# Patient Record
Sex: Female | Born: 1955
Health system: Southern US, Community
[De-identification: ages and names within clinical notes are randomized; demographics above are authoritative.]

## PROBLEM LIST (undated history)

## (undated) DIAGNOSIS — G4700 Insomnia, unspecified: Secondary | ICD-10-CM

## (undated) DIAGNOSIS — K449 Diaphragmatic hernia without obstruction or gangrene: Secondary | ICD-10-CM

## (undated) DIAGNOSIS — J302 Other seasonal allergic rhinitis: Secondary | ICD-10-CM

## (undated) DIAGNOSIS — D509 Iron deficiency anemia, unspecified: Secondary | ICD-10-CM

## (undated) DIAGNOSIS — K900 Celiac disease: Secondary | ICD-10-CM

## (undated) DIAGNOSIS — Z8619 Personal history of other infectious and parasitic diseases: Secondary | ICD-10-CM

## (undated) DIAGNOSIS — K648 Other hemorrhoids: Secondary | ICD-10-CM

## (undated) DIAGNOSIS — T7840XA Allergy, unspecified, initial encounter: Secondary | ICD-10-CM

## (undated) DIAGNOSIS — N200 Calculus of kidney: Secondary | ICD-10-CM

## (undated) DIAGNOSIS — K317 Polyp of stomach and duodenum: Secondary | ICD-10-CM

## (undated) DIAGNOSIS — D126 Benign neoplasm of colon, unspecified: Secondary | ICD-10-CM

## (undated) DIAGNOSIS — K5900 Constipation, unspecified: Secondary | ICD-10-CM

## (undated) DIAGNOSIS — K219 Gastro-esophageal reflux disease without esophagitis: Secondary | ICD-10-CM

## (undated) HISTORY — DX: Insomnia, unspecified: G47.00

## (undated) HISTORY — DX: Allergy, unspecified, initial encounter: T78.40XA

## (undated) HISTORY — DX: Benign neoplasm of colon, unspecified: D12.6

## (undated) HISTORY — DX: Polyp of stomach and duodenum: K31.7

## (undated) HISTORY — DX: Gastro-esophageal reflux disease without esophagitis: K21.9

## (undated) HISTORY — DX: Other seasonal allergic rhinitis: J30.2

## (undated) HISTORY — PX: UPPER GASTROINTESTINAL ENDOSCOPY: SHX188

## (undated) HISTORY — DX: Celiac disease: K90.0

## (undated) HISTORY — PX: COLONOSCOPY: SHX174

## (undated) HISTORY — DX: Diaphragmatic hernia without obstruction or gangrene: K44.9

## (undated) HISTORY — DX: Other hemorrhoids: K64.8

## (undated) HISTORY — DX: Personal history of other infectious and parasitic diseases: Z86.19

## (undated) HISTORY — DX: Calculus of kidney: N20.0

## (undated) HISTORY — DX: Iron deficiency anemia, unspecified: D50.9

## (undated) HISTORY — DX: Constipation, unspecified: K59.00

---
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
## 1997-07-16 ENCOUNTER — Other Ambulatory Visit: Admission: RE | Admit: 1997-07-16 | Discharge: 1997-07-16 | Payer: Self-pay | Admitting: Obstetrics and Gynecology

## 1998-03-01 ENCOUNTER — Ambulatory Visit (HOSPITAL_COMMUNITY): Admission: RE | Admit: 1998-03-01 | Discharge: 1998-03-01 | Payer: Self-pay | Admitting: Obstetrics and Gynecology

## 1998-03-01 ENCOUNTER — Encounter: Payer: Self-pay | Admitting: Obstetrics and Gynecology

## 1998-06-21 ENCOUNTER — Other Ambulatory Visit: Admission: RE | Admit: 1998-06-21 | Discharge: 1998-06-21 | Payer: Self-pay | Admitting: Obstetrics and Gynecology

## 1999-03-22 ENCOUNTER — Encounter: Payer: Self-pay | Admitting: Obstetrics and Gynecology

## 1999-03-22 ENCOUNTER — Ambulatory Visit (HOSPITAL_COMMUNITY): Admission: RE | Admit: 1999-03-22 | Discharge: 1999-03-22 | Payer: Self-pay | Admitting: Obstetrics and Gynecology

## 1999-03-28 ENCOUNTER — Ambulatory Visit (HOSPITAL_COMMUNITY): Admission: RE | Admit: 1999-03-28 | Discharge: 1999-03-28 | Payer: Self-pay | Admitting: Obstetrics and Gynecology

## 1999-03-28 ENCOUNTER — Encounter: Payer: Self-pay | Admitting: Obstetrics and Gynecology

## 1999-07-04 ENCOUNTER — Other Ambulatory Visit: Admission: RE | Admit: 1999-07-04 | Discharge: 1999-07-04 | Payer: Self-pay | Admitting: Obstetrics and Gynecology

## 2001-07-01 ENCOUNTER — Other Ambulatory Visit: Admission: RE | Admit: 2001-07-01 | Discharge: 2001-07-01 | Payer: Self-pay | Admitting: Obstetrics and Gynecology

## 2002-02-21 ENCOUNTER — Ambulatory Visit (HOSPITAL_COMMUNITY): Admission: RE | Admit: 2002-02-21 | Discharge: 2002-02-21 | Payer: Self-pay | Admitting: Obstetrics and Gynecology

## 2002-02-21 ENCOUNTER — Encounter: Payer: Self-pay | Admitting: Obstetrics and Gynecology

## 2003-03-02 ENCOUNTER — Ambulatory Visit (HOSPITAL_COMMUNITY): Admission: RE | Admit: 2003-03-02 | Discharge: 2003-03-02 | Payer: Self-pay | Admitting: Obstetrics and Gynecology

## 2003-09-04 ENCOUNTER — Other Ambulatory Visit: Admission: RE | Admit: 2003-09-04 | Discharge: 2003-09-04 | Payer: Self-pay | Admitting: Obstetrics and Gynecology

## 2003-11-04 ENCOUNTER — Encounter (INDEPENDENT_AMBULATORY_CARE_PROVIDER_SITE_OTHER): Payer: Self-pay | Admitting: Specialist

## 2003-11-04 ENCOUNTER — Ambulatory Visit (HOSPITAL_COMMUNITY): Admission: RE | Admit: 2003-11-04 | Discharge: 2003-11-04 | Payer: Self-pay | Admitting: *Deleted

## 2004-03-11 ENCOUNTER — Ambulatory Visit (HOSPITAL_COMMUNITY): Admission: RE | Admit: 2004-03-11 | Discharge: 2004-03-11 | Payer: Self-pay | Admitting: Obstetrics and Gynecology

## 2004-09-05 ENCOUNTER — Other Ambulatory Visit: Admission: RE | Admit: 2004-09-05 | Discharge: 2004-09-05 | Payer: Self-pay | Admitting: Obstetrics and Gynecology

## 2005-04-11 ENCOUNTER — Ambulatory Visit (HOSPITAL_COMMUNITY): Admission: RE | Admit: 2005-04-11 | Discharge: 2005-04-11 | Payer: Self-pay | Admitting: Obstetrics and Gynecology

## 2005-09-05 ENCOUNTER — Other Ambulatory Visit: Admission: RE | Admit: 2005-09-05 | Discharge: 2005-09-05 | Payer: Self-pay | Admitting: Obstetrics & Gynecology

## 2006-05-29 ENCOUNTER — Ambulatory Visit (HOSPITAL_COMMUNITY): Admission: RE | Admit: 2006-05-29 | Discharge: 2006-05-29 | Payer: Self-pay | Admitting: Obstetrics

## 2006-09-10 ENCOUNTER — Other Ambulatory Visit: Admission: RE | Admit: 2006-09-10 | Discharge: 2006-09-10 | Payer: Self-pay | Admitting: Obstetrics and Gynecology

## 2007-08-19 ENCOUNTER — Ambulatory Visit (HOSPITAL_COMMUNITY): Admission: RE | Admit: 2007-08-19 | Discharge: 2007-08-19 | Payer: Self-pay | Admitting: Obstetrics and Gynecology

## 2007-09-17 ENCOUNTER — Other Ambulatory Visit: Admission: RE | Admit: 2007-09-17 | Discharge: 2007-09-17 | Payer: Self-pay | Admitting: Obstetrics and Gynecology

## 2008-08-25 ENCOUNTER — Ambulatory Visit (HOSPITAL_COMMUNITY): Admission: RE | Admit: 2008-08-25 | Discharge: 2008-08-25 | Payer: Self-pay | Admitting: Obstetrics and Gynecology

## 2009-09-13 ENCOUNTER — Ambulatory Visit (HOSPITAL_COMMUNITY): Admission: RE | Admit: 2009-09-13 | Discharge: 2009-09-13 | Payer: Self-pay | Admitting: Obstetrics and Gynecology

## 2010-08-15 ENCOUNTER — Other Ambulatory Visit (HOSPITAL_COMMUNITY): Payer: Self-pay | Admitting: Occupational Therapy

## 2010-08-15 DIAGNOSIS — Z1231 Encounter for screening mammogram for malignant neoplasm of breast: Secondary | ICD-10-CM

## 2010-09-19 ENCOUNTER — Ambulatory Visit (HOSPITAL_COMMUNITY)
Admission: RE | Admit: 2010-09-19 | Discharge: 2010-09-19 | Disposition: A | Discharge status: Home / Self Care | Payer: 59 | Source: Ambulatory Visit | Attending: Obstetrics and Gynecology | Admitting: Obstetrics and Gynecology

## 2010-09-19 ENCOUNTER — Ambulatory Visit (HOSPITAL_COMMUNITY): Payer: Self-pay

## 2010-09-19 DIAGNOSIS — Z1231 Encounter for screening mammogram for malignant neoplasm of breast: Secondary | ICD-10-CM | POA: Insufficient documentation

## 2011-09-13 DIAGNOSIS — D126 Benign neoplasm of colon, unspecified: Secondary | ICD-10-CM

## 2011-09-13 HISTORY — PX: ESOPHAGOGASTRODUODENOSCOPY: SHX1529

## 2011-09-13 HISTORY — DX: Benign neoplasm of colon, unspecified: D12.6

## 2011-09-13 HISTORY — PX: COLONOSCOPY W/ BIOPSIES: SHX1374

## 2011-09-13 LAB — HM COLONOSCOPY

## 2011-09-18 ENCOUNTER — Other Ambulatory Visit (HOSPITAL_COMMUNITY): Payer: Self-pay | Admitting: Occupational Therapy

## 2011-09-18 DIAGNOSIS — Z1231 Encounter for screening mammogram for malignant neoplasm of breast: Secondary | ICD-10-CM

## 2011-10-04 ENCOUNTER — Ambulatory Visit (HOSPITAL_COMMUNITY)
Admission: RE | Admit: 2011-10-04 | Discharge: 2011-10-04 | Disposition: A | Discharge status: Home / Self Care | Payer: 59 | Source: Ambulatory Visit | Attending: Obstetrics & Gynecology | Admitting: Obstetrics & Gynecology

## 2011-10-04 DIAGNOSIS — Z1231 Encounter for screening mammogram for malignant neoplasm of breast: Secondary | ICD-10-CM | POA: Insufficient documentation

## 2011-10-24 LAB — HM PAP SMEAR: HM Pap smear: NEGATIVE

## 2011-12-15 HISTORY — PX: FOOT SURGERY: SHX648

## 2011-12-15 HISTORY — PX: BUNIONECTOMY: SHX129

## 2012-12-16 ENCOUNTER — Ambulatory Visit (INDEPENDENT_AMBULATORY_CARE_PROVIDER_SITE_OTHER): Payer: 59 | Admitting: Podiatry

## 2012-12-16 ENCOUNTER — Encounter: Payer: Self-pay | Admitting: Podiatry

## 2012-12-16 ENCOUNTER — Ambulatory Visit (INDEPENDENT_AMBULATORY_CARE_PROVIDER_SITE_OTHER): Payer: 59

## 2012-12-16 VITALS — BP 139/91 | HR 71 | Resp 18

## 2012-12-16 DIAGNOSIS — M79609 Pain in unspecified limb: Secondary | ICD-10-CM

## 2012-12-16 DIAGNOSIS — M79671 Pain in right foot: Secondary | ICD-10-CM

## 2012-12-16 DIAGNOSIS — M2010 Hallux valgus (acquired), unspecified foot: Secondary | ICD-10-CM

## 2012-12-16 NOTE — Progress Notes (Signed)
°  Subjective:    Patient ID: Tara Hendrix, female    DOB: 12-13-1955, 57 y.o.   MRN: [id number]  HPI rechecking right foot and some swelling and there is a bump on incision site    Review of Systems  Constitutional: Negative.   HENT: Negative.   Eyes: Negative.   Respiratory: Negative.   Cardiovascular: Negative.   Gastrointestinal: Negative.   Endocrine: Negative.   Genitourinary: Negative.   Musculoskeletal: Negative.   Skin: Negative.   Allergic/Immunologic: Negative.   Neurological: Negative.   Hematological: Bruises/bleeds easily.  Psychiatric/Behavioral: Negative.        Objective:   Physical Exam        Assessment & Plan:

## 2012-12-16 NOTE — Progress Notes (Signed)
Subjective:     Patient ID: Tara Hendrix, female   DOB: 1956/01/16, 57 y.o.   MRN: [id number]  HPI patient states I just wanted to check on my left big toe   Review of Systems  All other systems reviewed and are negative.       Objective:   Physical Exam  Nursing note and vitals reviewed. Cardiovascular: Intact distal pulses.   Neurological: She is alert.  Skin: Skin is warm.   this structure of the right big toe and first MPJ looks good mild edema noted. Slight prominence at the distal interphalangeal joint right big toe     Assessment:     We moved the big toe in order to reduce frontal plane deformity leading to slight enlargement    Plan:     X-rays reviewed and condition explained to patient. Patient has no pain and is discharged un less any issue should occur

## 2013-01-02 ENCOUNTER — Other Ambulatory Visit: Payer: Self-pay

## 2014-07-07 ENCOUNTER — Encounter: Payer: Self-pay | Admitting: Nurse Practitioner

## 2014-09-14 ENCOUNTER — Telehealth: Payer: Self-pay | Admitting: *Deleted

## 2014-09-14 ENCOUNTER — Ambulatory Visit (INDEPENDENT_AMBULATORY_CARE_PROVIDER_SITE_OTHER): Payer: 59 | Admitting: Nurse Practitioner

## 2014-09-14 ENCOUNTER — Encounter: Payer: Self-pay | Admitting: Nurse Practitioner

## 2014-09-14 VITALS — BP 130/76 | HR 76 | Ht 65.25 in | Wt 152.0 lb

## 2014-09-14 DIAGNOSIS — K2100 Gastro-esophageal reflux disease with esophagitis, without bleeding: Secondary | ICD-10-CM

## 2014-09-14 DIAGNOSIS — Z01419 Encounter for gynecological examination (general) (routine) without abnormal findings: Secondary | ICD-10-CM | POA: Diagnosis not present

## 2014-09-14 DIAGNOSIS — Z0000 Encounter for general adult medical examination without abnormal findings: Secondary | ICD-10-CM | POA: Diagnosis not present

## 2014-09-14 DIAGNOSIS — K219 Gastro-esophageal reflux disease without esophagitis: Secondary | ICD-10-CM | POA: Insufficient documentation

## 2014-09-14 LAB — POCT URINALYSIS DIPSTICK
Bilirubin, UA: NEGATIVE
Blood, UA: NEGATIVE
Glucose, UA: NEGATIVE
Ketones, UA: NEGATIVE
Leukocytes, UA: NEGATIVE
Nitrite, UA: NEGATIVE
Protein, UA: NEGATIVE
Urobilinogen, UA: NEGATIVE
pH, UA: 6

## 2014-09-14 MED ORDER — ZOLPIDEM TARTRATE 5 MG PO TABS: 5.0000 mg | ORAL_TABLET | Freq: Every evening | ORAL | Status: DC | PRN

## 2014-09-14 MED ORDER — ZOLPIDEM TARTRATE ER 6.25 MG PO TBCR: 6.2500 mg | EXTENDED_RELEASE_TABLET | Freq: Every evening | ORAL | Status: DC | PRN

## 2014-09-14 NOTE — Telephone Encounter (Signed)
New Rx faxed to [facility] today

## 2014-09-14 NOTE — Patient Instructions (Addendum)

## 2014-09-14 NOTE — Progress Notes (Signed)
Patient ID: Tara Hendrix, female   DOB: 03-22-1955, 59 y.o.   MRN: [id number] 59 y.o. G62P0010 Married  Caucasian Fe here for annual exam.  Now no meneses sice 11/ 2015.  Some warm in general no flashing. No vaginal dryness.  Patient's last menstrual period was 01/11/2014 (exact date).          Sexually active: Yes.    The current method of family planning is vasectomy.    Exercising: No.  The patient does not participate in regular exercise at present. Smoker:  Yes, 1 PPD  Health Maintenance: Pap:  10/24/11, negative with neg HR HPV MMG:  10/04/11, Bi-Rads 1:  Negative Colonoscopy:  09/13/11, tubular adenoma, repeat in 5 years BMD:   2008, no report in chart, normal per patient TDaP:  10/23/10 Labs:  HB:  15.6  Urine:  Negative    reports that she has been smoking Cigarettes.  She has been smoking about 1.00 pack per day. She has never used smokeless tobacco. She reports that she drinks about 6.0 oz of alcohol per week. She reports that she does not use illicit drugs.  Past Medical History  Diagnosis Date  . GERD (gastroesophageal reflux disease)     Past Surgical History  Procedure Laterality Date  . Foot surgery Right 12/15/2011    Current Outpatient Prescriptions  Medication Sig Dispense Refill  . zolpidem (AMBIEN) 5 MG tablet Take 1 tablet (5 mg total) by mouth at bedtime as needed for sleep. 30 tablet 0   No current facility-administered medications for this visit.    Family History  Problem Relation Age of Onset  . Heart attack Mother   . Hypertension Mother   . Cancer Paternal Grandmother     unsure type  . Stroke Paternal Grandfather     ROS:  Pertinent items are noted in HPI.  Otherwise, a comprehensive ROS was negative.  Exam:   BP 130/76 mmHg  Pulse 76  Ht 5' 5.25" (1.657 m)  Wt 152 lb (68.947 kg)  BMI 25.11 kg/m2  LMP 01/11/2014 (Exact Date) Height: 5' 5.25" (165.7 cm) Ht Readings from Last 3 Encounters:  09/14/14 5' 5.25" (1.657 m)    General  appearance: alert, cooperative and appears stated age Head: Normocephalic, without obvious abnormality, atraumatic Neck: no adenopathy, supple, symmetrical, trachea midline and thyroid normal to inspection and palpation Lungs: clear to auscultation bilaterally Breasts: normal appearance, no masses or tenderness Heart: regular rate and rhythm Abdomen: soft, non-tender; no masses,  no organomegaly Extremities: extremities normal, atraumatic, no cyanosis or edema Skin: Skin color, texture, turgor normal. No rashes or lesions Lymph nodes: Cervical, supraclavicular, and axillary nodes normal. No abnormal inguinal nodes palpated Neurologic: Grossly normal   Pelvic: External genitalia:  no lesions              Urethra:  normal appearing urethra with no masses, tenderness or lesions              Bartholin's and Skene's: normal                 Vagina: normal appearing vagina with normal color and discharge, no lesions              Cervix: anteverted              Pap taken: Yes.   Bimanual Exam:  Uterus:  normal size, contour, position, consistency, mobility, non-tender              Adnexa: no  mass, fullness, tenderness               Rectovaginal: Confirms               Anus:  normal sphincter tone, no lesions  Chaperone present:  yes  A:  Well Woman with normal exam  Postmenopausal   GERD and would like to see a different GI than previous  P:   Reviewed health and wellness pertinent to exam  Pap smear as above  Mammogram isdue and will schedule counseled on breast self exam, mammography screening, adequate intake of calcium and vitamin D, diet and exercise, Kegel's exercises return annually or prn  An After Visit Summary was printed and given to the patient.

## 2014-09-14 NOTE — Telephone Encounter (Signed)
Tara Hendrix from [facility] calling to get Rx for Ambien CR 6.25 mg changed. States insurance will not cover any Ambien Cr, just regular Ambien. If allowed new Rx for regular Ambien 5 mg or 10 mg needed.

## 2014-09-14 NOTE — Telephone Encounter (Signed)
OK for new RX of Ambien to be 5 mg prn sleep # 30 / 0 refills

## 2014-09-15 ENCOUNTER — Other Ambulatory Visit (INDEPENDENT_AMBULATORY_CARE_PROVIDER_SITE_OTHER): Payer: 59

## 2014-09-15 DIAGNOSIS — Z0000 Encounter for general adult medical examination without abnormal findings: Secondary | ICD-10-CM

## 2014-09-15 LAB — COMPREHENSIVE METABOLIC PANEL
ALT: 11 U/L (ref 0–35)
AST: 12 U/L (ref 0–37)
Albumin: 4 g/dL (ref 3.5–5.2)
Alkaline Phosphatase: 63 U/L (ref 39–117)
BUN: 11 mg/dL (ref 6–23)
CO2: 25 mEq/L (ref 19–32)
Calcium: 9.4 mg/dL (ref 8.4–10.5)
Chloride: 103 mEq/L (ref 96–112)
Creat: 0.72 mg/dL (ref 0.50–1.10)
Glucose, Bld: 106 mg/dL — ABNORMAL HIGH (ref 70–99)
Potassium: 4.2 mEq/L (ref 3.5–5.3)
Sodium: 139 mEq/L (ref 135–145)
Total Bilirubin: 0.5 mg/dL (ref 0.2–1.2)
Total Protein: 6.7 g/dL (ref 6.0–8.3)

## 2014-09-15 LAB — LIPID PANEL
Cholesterol: 241 mg/dL — ABNORMAL HIGH (ref 0–200)
HDL: 51 mg/dL (ref >=46)
LDL Cholesterol: 156 mg/dL — ABNORMAL HIGH (ref 0–99)
Total CHOL/HDL Ratio: 4.7 Ratio
Triglycerides: 172 mg/dL — ABNORMAL HIGH (ref <150)
VLDL: 34 mg/dL (ref 0–40)

## 2014-09-15 LAB — FOLLICLE STIMULATING HORMONE: FSH: 107.1 m[IU]/mL

## 2014-09-15 LAB — HEMOGLOBIN, FINGERSTICK: Hemoglobin, fingerstick: 15.6 g/dL (ref 12.0–16.0)

## 2014-09-16 LAB — IPS PAP TEST WITH HPV

## 2014-09-16 LAB — VITAMIN D 25 HYDROXY (VIT D DEFICIENCY, FRACTURES): Vit D, 25-Hydroxy: 26 ng/mL — ABNORMAL LOW (ref 30–100)

## 2014-09-16 LAB — TSH: TSH: 1.798 u[IU]/mL (ref 0.350–4.500)

## 2014-09-21 NOTE — Progress Notes (Signed)
Encounter reviewed by Dr. Brook Amundson C. Silva.  

## 2014-12-22 ENCOUNTER — Telehealth: Payer: Self-pay | Admitting: *Deleted

## 2014-12-22 DIAGNOSIS — E785 Hyperlipidemia, unspecified: Secondary | ICD-10-CM

## 2014-12-22 DIAGNOSIS — R899 Unspecified abnormal finding in specimens from other organs, systems and tissues: Secondary | ICD-10-CM

## 2014-12-22 NOTE — Telephone Encounter (Signed)
I spoke with patient to schedule lab appointment.  Pt is scheduled for 10/28 @ 8:45am. Orders entered in EPIC.

## 2014-12-22 NOTE — Telephone Encounter (Signed)
-----   Message from Graylon Good, [place] sent at 09/16/2014 10:43 AM EDT ----- Regarding: repeat labs Need HGB AIC 3 months from 09/14/14.

## 2014-12-25 ENCOUNTER — Other Ambulatory Visit: Payer: 59

## 2014-12-25 DIAGNOSIS — R6889 Other general symptoms and signs: Secondary | ICD-10-CM

## 2014-12-25 DIAGNOSIS — E785 Hyperlipidemia, unspecified: Secondary | ICD-10-CM

## 2014-12-25 DIAGNOSIS — R899 Unspecified abnormal finding in specimens from other organs, systems and tissues: Secondary | ICD-10-CM

## 2014-12-25 LAB — HEMOGLOBIN A1C
Hgb A1c MFr Bld: 5.7 % — ABNORMAL HIGH (ref <5.7)
Mean Plasma Glucose: 117 mg/dL — ABNORMAL HIGH (ref <117)

## 2014-12-25 LAB — LIPID PANEL
Cholesterol: 255 mg/dL — ABNORMAL HIGH (ref 125–200)
HDL: 85 mg/dL (ref >=46)
LDL Cholesterol: 131 mg/dL — ABNORMAL HIGH (ref <130)
Total CHOL/HDL Ratio: 3 Ratio (ref <=5.0)
Triglycerides: 194 mg/dL — ABNORMAL HIGH (ref <150)
VLDL: 39 mg/dL — ABNORMAL HIGH (ref <30)

## 2015-09-27 ENCOUNTER — Ambulatory Visit (INDEPENDENT_AMBULATORY_CARE_PROVIDER_SITE_OTHER): Payer: 59 | Admitting: Nurse Practitioner

## 2015-09-27 ENCOUNTER — Encounter: Payer: Self-pay | Admitting: Nurse Practitioner

## 2015-09-27 VITALS — BP 134/88 | HR 68 | Ht 65.25 in | Wt 153.0 lb

## 2015-09-27 DIAGNOSIS — Z01419 Encounter for gynecological examination (general) (routine) without abnormal findings: Secondary | ICD-10-CM

## 2015-09-27 DIAGNOSIS — G4700 Insomnia, unspecified: Secondary | ICD-10-CM | POA: Diagnosis not present

## 2015-09-27 DIAGNOSIS — Z0000 Encounter for general adult medical examination without abnormal findings: Secondary | ICD-10-CM

## 2015-09-27 DIAGNOSIS — N950 Postmenopausal bleeding: Secondary | ICD-10-CM

## 2015-09-27 LAB — CBC WITH DIFFERENTIAL/PLATELET
Basophils Absolute: 0 cells/uL (ref 0–200)
Basophils Relative: 0 %
Eosinophils Absolute: 174 cells/uL (ref 15–500)
Eosinophils Relative: 2 %
HCT: 43 % (ref 35.0–45.0)
Hemoglobin: 14.6 g/dL (ref 11.7–15.5)
Lymphocytes Relative: 21 %
Lymphs Abs: 1827 cells/uL (ref 850–3900)
MCH: 33.4 pg — ABNORMAL HIGH (ref 27.0–33.0)
MCHC: 34 g/dL (ref 32.0–36.0)
MCV: 98.4 fL (ref 80.0–100.0)
MPV: 11 fL (ref 7.5–12.5)
Monocytes Absolute: 609 cells/uL (ref 200–950)
Monocytes Relative: 7 %
Neutro Abs: 6090 cells/uL (ref 1500–7800)
Neutrophils Relative %: 70 %
Platelets: 199 10*3/uL (ref 140–400)
RBC: 4.37 MIL/uL (ref 3.80–5.10)
RDW: 12.8 % (ref 11.0–15.0)
WBC: 8.7 10*3/uL (ref 3.8–10.8)

## 2015-09-27 LAB — POCT URINALYSIS DIPSTICK
Bilirubin, UA: NEGATIVE
Blood, UA: NEGATIVE
Glucose, UA: NEGATIVE
Ketones, UA: NEGATIVE
Leukocytes, UA: NEGATIVE
Nitrite, UA: NEGATIVE
Protein, UA: NEGATIVE
Urobilinogen, UA: NEGATIVE
pH, UA: 5

## 2015-09-27 LAB — COMPREHENSIVE METABOLIC PANEL
ALT: 14 U/L (ref 6–29)
AST: 15 U/L (ref 10–35)
Albumin: 4.5 g/dL (ref 3.6–5.1)
Alkaline Phosphatase: 64 U/L (ref 33–130)
BUN: 14 mg/dL (ref 7–25)
CO2: 23 mmol/L (ref 20–31)
Calcium: 9.7 mg/dL (ref 8.6–10.4)
Chloride: 104 mmol/L (ref 98–110)
Creat: 0.8 mg/dL (ref 0.50–1.05)
Glucose, Bld: 117 mg/dL — ABNORMAL HIGH (ref 65–99)
Potassium: 5 mmol/L (ref 3.5–5.3)
Sodium: 138 mmol/L (ref 135–146)
Total Bilirubin: 0.6 mg/dL (ref 0.2–1.2)
Total Protein: 6.6 g/dL (ref 6.1–8.1)

## 2015-09-27 LAB — LIPID PANEL
Cholesterol: 251 mg/dL — ABNORMAL HIGH (ref 125–200)
HDL: 80 mg/dL (ref >=46)
LDL Cholesterol: 141 mg/dL — ABNORMAL HIGH (ref <130)
Total CHOL/HDL Ratio: 3.1 Ratio (ref <=5.0)
Triglycerides: 152 mg/dL — ABNORMAL HIGH (ref <150)
VLDL: 30 mg/dL (ref <30)

## 2015-09-27 LAB — TSH: TSH: 1.48 mIU/L

## 2015-09-27 LAB — HEMOGLOBIN A1C
Hgb A1c MFr Bld: 5.6 % (ref <5.7)
Mean Plasma Glucose: 114 mg/dL

## 2015-09-27 LAB — HEPATITIS C ANTIBODY: HCV Ab: NEGATIVE

## 2015-09-27 LAB — HIV ANTIBODY (ROUTINE TESTING W REFLEX): HIV 1&2 Ab, 4th Generation: NONREACTIVE

## 2015-09-27 LAB — HEMOGLOBIN, FINGERSTICK: Hemoglobin, fingerstick: 14.5 g/dL (ref 12.0–16.0)

## 2015-09-27 MED ORDER — ZOLPIDEM TARTRATE 5 MG PO TABS: 5.0000 mg | ORAL_TABLET | Freq: Every evening | ORAL | 5 refills | Status: DC | PRN

## 2015-09-27 NOTE — Progress Notes (Signed)
Patient ID: Tara Hendrix, female   DOB: April 03, 1955, 60 y.o.   MRN: [id number]  60 y.o. G39P0010 Married  Caucasian Fe here for annual exam. No new health diagnosis. No vaso symptoms. About 6 & 8 months ago had a spot of pink mucous when she wiped.  No discharge on panty liner and no other discharge the same day. She did note symptoms of a cramp like period was going to start but nothing else.  Symptoms of bleeding seemed to go away after wiping.  She does not recall either time being related to SA.  She also did not have any urinary symptoms.  Patient's last menstrual period was 01/11/2014 (exact date).          Sexually active: Yes.    The current method of family planning is vasectomy.    Exercising: No.  The patient does not participate in regular exercise at present. Smoker:  yes  Health Maintenance: Pap: 09/14/14, Negative with neg HR HPV MMG:  10/04/11, Bi-Rads 1:  Negative, pt states she had mammogram 2 years ago at [facility] - no report Colonoscopy:  09/13/11, tubular adenoma, repeat in 5 years BMD:   2008, no report in chart, normal per patient TDaP:  10/23/10 Hep C and HIV: done today Labs: HB: 14.5  Urine: negative   reports that she has been smoking Cigarettes.  She has been smoking about 1.00 pack per day. She has never used smokeless tobacco. She reports that she drinks about 6.0 oz of alcohol per week . She reports that she does not use drugs.  Past Medical History:  Diagnosis Date  . GERD (gastroesophageal reflux disease)     Past Surgical History:  Procedure Laterality Date  . FOOT SURGERY Right 12/15/2011    Current Outpatient Prescriptions  Medication Sig Dispense Refill  . zolpidem (AMBIEN) 5 MG tablet Take 1 tablet (5 mg total) by mouth at bedtime as needed for sleep. 30 tablet 0   No current facility-administered medications for this visit.     Family History  Problem Relation Age of Onset  . Heart attack Mother   . Hypertension Mother   . Cancer  Paternal Grandmother     unsure type  . Stroke Paternal Grandfather     ROS:  Pertinent items are noted in HPI.  Otherwise, a comprehensive ROS was negative.  Exam:   BP 134/88 (BP Location: Right Arm, Patient Position: Sitting, Cuff Size: Normal)   Pulse 68   Ht 5' 5.25" (1.657 m)   Wt 153 lb (69.4 kg)   LMP 01/11/2014 (Exact Date)   BMI 25.27 kg/m  Height: 5' 5.25" (165.7 cm) Ht Readings from Last 3 Encounters:  09/27/15 5' 5.25" (1.657 m)  09/14/14 5' 5.25" (1.657 m)    General appearance: alert, cooperative and appears stated age Head: Normocephalic, without obvious abnormality, atraumatic Neck: no adenopathy, supple, symmetrical, trachea midline and thyroid normal to inspection and palpation Lungs: clear to auscultation bilaterally Breasts: normal appearance, no masses or tenderness Heart: regular rate and rhythm Abdomen: soft, non-tender; no masses,  no organomegaly Extremities: extremities normal, atraumatic, no cyanosis or edema Skin: Skin color, texture, turgor normal. No rashes or lesions Lymph nodes: Cervical, supraclavicular, and axillary nodes normal. No abnormal inguinal nodes palpated Neurologic: Grossly normal   Pelvic: External genitalia:  no lesions              Urethra:  normal appearing urethra with no masses, tenderness or lesions  Bartholin's and Skene's: normal                 Vagina: normal appearing vagina with normal color and discharge, no lesions              Cervix: anteverted, no cervical polyp noted              Pap taken: No. Bimanual Exam:  Uterus:  normal size, contour, position, consistency, mobility, non-tender              Adnexa: no mass, fullness, tenderness               Rectovaginal: Confirms               Anus:  normal sphincter tone, no lesions  Chaperone present: yes  A:  Well Woman with normal exam  Postmenopausal - last [place] was 107.1 09/14/14  ? PMB and will evaluate              Insomnia - chronic due to  travel schedule - works outside [employer] job.   P:   Reviewed health and wellness pertinent to exam  Pap smear as above  Mammogram is past due and will schedule - [facility]  Will get EMB with Dr. Sabra Heck and follow, she may need a PUS later depending on findings  Will follow with labs  Refill on Ambien 5 mg HS prn  She will get colonoscopy next year  Counseled on breast self exam, mammography screening, adequate intake of calcium and vitamin D, diet and exercise, Kegel's exercises return annually or prn  An After Visit Summary was printed and given to the patient.

## 2015-09-27 NOTE — Patient Instructions (Signed)

## 2015-09-27 NOTE — Progress Notes (Signed)
Reviewed personally.  M. Suzanne Nicodemus Denk, MD.  

## 2015-09-28 LAB — VITAMIN D 25 HYDROXY (VIT D DEFICIENCY, FRACTURES): Vit D, 25-Hydroxy: 32 ng/mL (ref 30–100)

## 2015-10-07 ENCOUNTER — Telehealth: Payer: Self-pay | Admitting: *Deleted

## 2015-10-07 NOTE — Telephone Encounter (Signed)
I have attempted to contact this patient by phone with the following results: left message to return call to [place] at [phone] on answering machine (mobile per [facility]).  Name verified in [place], advised call was regarding recent labs and dietician referral.  [phone] [facility])

## 2015-10-07 NOTE — Telephone Encounter (Signed)
-----   Message from Kem Boroughs, [place] sent at 09/28/2015  8:39 AM EDT ----- Results via my chart:  Call her and see if she wants Dietician referral.  Pam,  The Hep C and HIV was negative as expected. The Vit D was 32 compared to 26 last year.  Please continue to eat dark green vegetables.  The HGB AIC (average blood sugar for 3 mo) was 5.6 - better than last year.  But still bears watching your carb's. The blood sugar in the CMP was at 117 - which is OK if you were not fasting.  The thyroid, kidney, liver, CBC was all normal.  The lipid panel shows an elevated total cholesterol at 251, triglyceride at 152 and LDL (bad cholesterol) at 141.  In comparison to 9 months ago there is some improvement of the triglyceride but about the same for the others.  Do you feel you need to see a dietician?  If so we can help with that.  You will also need to follow with PCP as you may need medication therapy.

## 2015-10-08 ENCOUNTER — Telehealth: Payer: Self-pay | Admitting: Obstetrics & Gynecology

## 2015-10-08 NOTE — Telephone Encounter (Signed)
Called patient to review benefits for procedure. Left voicemail to call back and review. °

## 2015-10-08 NOTE — Telephone Encounter (Signed)
Spoke with patient. Reviewed benefits. Ok to close.

## 2015-11-09 ENCOUNTER — Ambulatory Visit (INDEPENDENT_AMBULATORY_CARE_PROVIDER_SITE_OTHER): Payer: 59 | Admitting: Obstetrics & Gynecology

## 2015-11-09 DIAGNOSIS — N950 Postmenopausal bleeding: Secondary | ICD-10-CM | POA: Diagnosis not present

## 2015-11-09 NOTE — Patient Instructions (Signed)

## 2015-11-09 NOTE — Progress Notes (Signed)
GYNECOLOGY  VISIT   HPI: 60 y.o. G18P0010 Married Caucasian female with two episode of PMP bleeding about six months ago.  She noticed this with wiping only.  She had [facility] 09/12/15 that was 107.  Pt was recommended to proceed with endometrial biopsy by Kem Boroughs.  She is here for this today.  Reports with the bleeding, she had no breast tenderness or other hormonal-type symptoms.  Denies any urinary changes/concerns or bowel habit changes.  Does not think this was urinary or GI related.  Last MMG was 2013 in chart.  Pt is sure she's had one within the last two years but thought [facility] was [facility].  Last colonoscopy was 7/13.  Follow up due next year.  Patient Active Problem List   Diagnosis Date Noted  . GERD (gastroesophageal reflux disease)     Past Medical History:  Diagnosis Date  . GERD (gastroesophageal reflux disease)     Past Surgical History:  Procedure Laterality Date  . FOOT SURGERY Right 12/15/2011    MEDS:  Reviewed in EPIC and UTD  ALLERGIES: Review of patient's allergies indicates no known allergies.  Family History  Problem Relation Age of Onset  . Heart attack Mother   . Hypertension Mother   . Cancer Paternal Grandmother     unsure type  . Stroke Paternal Grandfather     SH:  Married, non smoker  Review of Systems  All other systems reviewed and are negative.   PHYSICAL EXAMINATION:    BP (!) 144/86 (BP Location: Right Arm, Patient Position: Sitting, Cuff Size: Normal)   Pulse 78   Resp 14   Ht 5' 5.25" (1.657 m)   Wt 154 lb (69.9 kg)   LMP 01/11/2014 (Exact Date)   BMI 25.43 kg/m     General appearance: alert, cooperative and appears stated age  Pelvic: External genitalia:  no lesions              Urethra:  normal appearing urethra with no masses, tenderness or lesions              Bartholins and Skenes: normal                 Vagina: normal appearing vagina with normal color and discharge, no lesions              Cervix: no  lesions              Bimanual Exam:  Uterus:  normal size, contour, position, consistency, mobility, non-tender              Adnexa: no mass, fullness, tenderness              Rectovaginal: Yes.  .               Anus:  normal sphincter tone, no lesions  Endometrial biopsy recommended.  Discussed with patient.  Verbal and written consent obtained.   Procedure:  Speculum placed.  Cervix visualized and cleansed with betadine prep.  A single toothed tenaculum was applied to the anterior lip of the cervix.  Endometrial pipelle was advanced through the cervix into the endometrial cavity without difficulty.  Pipelle passed to 7cm.  Suction applied and pipelle removed with scant tissue sample obtained.  Second pass was also performed.  Tenculum removed.  No bleeding noted.  Patient tolerated procedure well.  Chaperone was present for exam.  Assessment: PMP bleeding, no HRT MMG likely overdue  Plan: Biopsy pending.  Results will be called to pt.  She is leaving for [place] for a work trip on Saturday.  She will have access to voice mails so ok to leave message next week if needed.  Will wait for biopsy results before making any additional recommendations.

## 2015-11-11 NOTE — Telephone Encounter (Signed)
Patient has returned call and completed appointment for endometrial biopsy on 11/09/15. Closing encounter.

## 2016-08-31 ENCOUNTER — Telehealth: Payer: Self-pay | Admitting: Nurse Practitioner

## 2016-08-31 NOTE — Telephone Encounter (Signed)
Left message regarding upcoming appointment has been canceled and needs to be rescheduled. °

## 2016-10-02 ENCOUNTER — Ambulatory Visit: Payer: 59 | Admitting: Nurse Practitioner

## 2016-10-23 ENCOUNTER — Ambulatory Visit: Payer: 59 | Admitting: Obstetrics & Gynecology

## 2016-11-24 ENCOUNTER — Encounter: Payer: Self-pay | Admitting: Obstetrics & Gynecology

## 2016-11-27 ENCOUNTER — Telehealth: Payer: Self-pay | Admitting: Obstetrics & Gynecology

## 2016-11-27 NOTE — Telephone Encounter (Signed)
Patient requesting her blood type

## 2016-11-27 NOTE — Telephone Encounter (Signed)
Spoke with patient. Advised per records we do not have her blood type on file. Patient states she has given blood. Provided the telephone number to the [place] [phone] for patient to contact and obtain her blood type. Patient verbalizes understanding.  Routing to provider for final review. Patient agreeable to disposition. Will close encounter.

## 2017-01-08 ENCOUNTER — Ambulatory Visit: Payer: 59 | Admitting: Obstetrics & Gynecology

## 2017-01-11 ENCOUNTER — Other Ambulatory Visit: Payer: Self-pay

## 2017-01-11 ENCOUNTER — Ambulatory Visit: Payer: 59 | Admitting: Obstetrics & Gynecology

## 2017-01-11 ENCOUNTER — Encounter: Payer: Self-pay | Admitting: Obstetrics & Gynecology

## 2017-01-11 VITALS — BP 138/60 | HR 86 | Resp 16 | Ht 65.0 in | Wt 152.0 lb

## 2017-01-11 DIAGNOSIS — N368 Other specified disorders of urethra: Secondary | ICD-10-CM | POA: Diagnosis not present

## 2017-01-11 DIAGNOSIS — Z0000 Encounter for general adult medical examination without abnormal findings: Secondary | ICD-10-CM

## 2017-01-11 DIAGNOSIS — Z1211 Encounter for screening for malignant neoplasm of colon: Secondary | ICD-10-CM

## 2017-01-11 DIAGNOSIS — Z01411 Encounter for gynecological examination (general) (routine) with abnormal findings: Secondary | ICD-10-CM | POA: Diagnosis not present

## 2017-01-11 MED ORDER — ZOLPIDEM TARTRATE 5 MG PO TABS: 5.0000 mg | ORAL_TABLET | Freq: Every evening | ORAL | 2 refills | Status: DC | PRN

## 2017-01-11 NOTE — Progress Notes (Signed)
61 y.o. G1P0010 MarriedCaucasianF here for annual exam. Doing well.  Denies vaginal bleeding.   Had executive physical at [facility] last December.  Results reviewed in Epic.    Patient's last menstrual period was 01/11/2014 (exact date).          Sexually active: Yes.    The current method of family planning is post menopausal status.    Exercising: No.  The patient does not participate in regular exercise at present. Smoker:  yes  Health Maintenance: Pap:  09/14/14 Neg. HR HPV:neg  History of abnormal Pap:  no MMG:  02/01/16 BIRADS0: Needs additional imaging of the left breast. Patient didn't follow up. Care Everywhere. Pt states that comparison films could not be obtained.  No specific follow up was recommended.   Colonoscopy: 08/2011 f/u 5 years.  BMD:   02/01/16 Normal. Care everywhere.  TDaP: 2012 Pneumonia vaccine(s):  2017 Zostavax:   2013  Hep C testing: 09/27/15 Neg  Screening Labs: done with executive physical   reports that she has been smoking cigarettes.  She has been smoking about 1.00 pack per day. she has never used smokeless tobacco. She reports that she drinks about 6.0 oz of alcohol per week. She reports that she does not use drugs.  Past Medical History:  Diagnosis Date  . GERD (gastroesophageal reflux disease)     Past Surgical History:  Procedure Laterality Date  . FOOT SURGERY Right 12/15/2011    Current Outpatient Medications  Medication Sig Dispense Refill  . zolpidem (AMBIEN) 5 MG tablet Take 1 tablet (5 mg total) by mouth at bedtime as needed for sleep. 30 tablet 5   No current facility-administered medications for this visit.     Family History  Problem Relation Age of Onset  . Heart attack Mother   . Hypertension Mother   . Cancer Paternal Grandmother        unsure type  . Stroke Paternal Grandfather     ROS:  Pertinent items are noted in HPI.  Otherwise, a comprehensive ROS was negative.  Exam:   BP 138/60 (BP Location: Right Arm,  Patient Position: Sitting, Cuff Size: Normal)   Pulse 86   Resp 16   Ht 5\' 5"  (1.651 m)   Wt 152 lb (68.9 kg)   LMP 01/11/2014 (Exact Date)   BMI 25.29 kg/m     Height: 5\' 5"  (165.1 cm)  Ht Readings from Last 3 Encounters:  01/11/17 5\' 5"  (1.651 m)  11/09/15 5' 5.25" (1.657 m)  09/27/15 5' 5.25" (1.657 m)    General appearance: alert, cooperative and appears stated age Head: Normocephalic, without obvious abnormality, atraumatic Neck: no adenopathy, supple, symmetrical, trachea midline and thyroid normal to inspection and palpation Lungs: clear to auscultation bilaterally Breasts: normal appearance, no masses or tenderness Heart: regular rate and rhythm Abdomen: soft, non-tender; bowel sounds normal; no masses,  no organomegaly Extremities: extremities normal, atraumatic, no cyanosis or edema Skin: Skin color, texture, turgor normal. No rashes or lesions Lymph nodes: Cervical, supraclavicular, and axillary nodes normal. No abnormal inguinal nodes palpated Neurologic: Grossly normal   Pelvic: External genitalia:  no lesions              Urethra:  normal appearing urethra with no masses, 1.5cm cystic urethral mass/cyst just to left of urethra              Bartholins and Skenes: normal  Vagina: normal appearing vagina with normal color and discharge, no lesions              Cervix: no lesions              Pap taken: No. Bimanual Exam:  Uterus:  normal size, contour, position, consistency, mobility, non-tender              Adnexa: normal adnexa and no mass, fullness, tenderness               Rectovaginal: Confirms               Anus:  normal sphincter tone, no lesions  Chaperone was present for exam.  A:  Well Woman with normal exam PMP, no HRT Insomnia Urethral mass noted on exam today  P:   Mammogram guidelines reviewed.  She is going to do 3D MMG in December pap smear with neg HR HVP 10/16.  Not obtained today.  Will repeat one year. Spoke with Dr.  Matilde Sprang regarding urethral mass.  Advised appt for evaluation.  Referral placed. Return lipids and HbA1c orders placed Referral for colonoscopy placed today AEX 1 year or follow up prn

## 2017-01-15 ENCOUNTER — Other Ambulatory Visit: Payer: 59

## 2017-01-15 ENCOUNTER — Telehealth: Payer: Self-pay | Admitting: Internal Medicine

## 2017-01-15 ENCOUNTER — Telehealth: Payer: Self-pay | Admitting: *Deleted

## 2017-01-15 NOTE — Telephone Encounter (Signed)
Spoke with Ashok Croon, Teaching laboratory technician. First available appt with Dr. Matilde Sprang in January. Ashok Croon will review scheduling for earlier appt with Dr. Matilde Sprang and return call with update.

## 2017-01-15 NOTE — Telephone Encounter (Signed)
Records have been placed on Dr.Gessner's desk for review.

## 2017-01-15 NOTE — Telephone Encounter (Signed)
Spoke with patient, advised as seen below, [facility] will contact to schedule. Patient verbalizes understanding and is agreeable.   Routing to provider for final review. Patient is agreeable to disposition. Will close encounter.  Cc: Tara Hendrix

## 2017-01-15 NOTE — Telephone Encounter (Signed)
[place] returning call in regards to appt with Dr. Matilde Sprang. Ashok Croon states reviewed patient and = scheduling with Dr. Matilde Sprang, ok to schedule in January. Was advised January schedule not available for scheduling, Ashok Croon will call patient to schedule once schedule is published. Patient contact information provided, will forward to our referral department for f/u.

## 2017-01-15 NOTE — Telephone Encounter (Signed)
Spoke with patient, advised as seen below per Dr. Sabra Heck. Patient will be out of town 12/12 - 12/17. Advised patient will return call with appt details. Patient is agreeable.      Megan Salon, MD  P [facility] Triage Pool  Can you please call pt and let her know I spoke with Dr. Matilde Sprang, urology, who advised she be referred for additional evaluation of the peri-urethral mass/cyst I noted on her exam last week. He did not think she needed any imaging yet. I've put in the referral for this. Could you call for the appt and give her all of this information at the same time? Thanks.

## 2017-01-16 ENCOUNTER — Encounter: Payer: Self-pay | Admitting: Internal Medicine

## 2017-01-16 NOTE — Telephone Encounter (Signed)
Dr.Gessner reviewed records and accepted for patient to be scheduled for a direct colonoscopy. Left message for patient to call back and schedule an appointment.

## 2017-01-17 ENCOUNTER — Other Ambulatory Visit (INDEPENDENT_AMBULATORY_CARE_PROVIDER_SITE_OTHER): Payer: 59

## 2017-01-17 DIAGNOSIS — Z0000 Encounter for general adult medical examination without abnormal findings: Secondary | ICD-10-CM

## 2017-01-18 LAB — LIPID PANEL
Chol/HDL Ratio: 3.3 ratio (ref 0.0–4.4)
Cholesterol, Total: 241 mg/dL — ABNORMAL HIGH (ref 100–199)
HDL: 74 mg/dL (ref >39)
LDL Calculated: 145 mg/dL — ABNORMAL HIGH (ref 0–99)
Triglycerides: 108 mg/dL (ref 0–149)
VLDL Cholesterol Cal: 22 mg/dL (ref 5–40)

## 2017-01-18 LAB — HEMOGLOBIN A1C
Est. average glucose Bld gHb Est-mCnc: 114 mg/dL
Hgb A1c MFr Bld: 5.6 % (ref 4.8–5.6)

## 2017-01-23 ENCOUNTER — Other Ambulatory Visit: Payer: Self-pay | Admitting: Obstetrics & Gynecology

## 2017-01-23 DIAGNOSIS — Z139 Encounter for screening, unspecified: Secondary | ICD-10-CM

## 2017-01-26 ENCOUNTER — Telehealth: Payer: Self-pay | Admitting: *Deleted

## 2017-01-26 NOTE — Telephone Encounter (Signed)
Spoke with Tara Hendrix at [facility]. Patient is scheduled with Dr. Matilde Sprang on 03/06/17, patient has been notified by [facility] of appt.   Fax copy of OV notes to [phone].   OV notes dated 01/11/17 faxed as requested.   Routing to provider for final review. Patient is agreeable to disposition. Will close encounter.  Cc: Lerry Liner

## 2017-02-21 ENCOUNTER — Ambulatory Visit
Admission: RE | Admit: 2017-02-21 | Discharge: 2017-02-21 | Disposition: A | Discharge status: Home / Self Care | Payer: 59 | Source: Ambulatory Visit | Attending: Obstetrics & Gynecology | Admitting: Obstetrics & Gynecology

## 2017-02-21 DIAGNOSIS — Z139 Encounter for screening, unspecified: Secondary | ICD-10-CM

## 2017-02-22 ENCOUNTER — Other Ambulatory Visit: Payer: Self-pay | Admitting: Obstetrics & Gynecology

## 2017-02-22 DIAGNOSIS — R928 Other abnormal and inconclusive findings on diagnostic imaging of breast: Secondary | ICD-10-CM

## 2017-02-28 ENCOUNTER — Other Ambulatory Visit: Payer: 59

## 2017-03-15 ENCOUNTER — Ambulatory Visit: Payer: 59 | Admitting: Internal Medicine

## 2017-03-28 ENCOUNTER — Ambulatory Visit
Admission: RE | Admit: 2017-03-28 | Discharge: 2017-03-28 | Disposition: A | Discharge status: Home / Self Care | Payer: 59 | Source: Ambulatory Visit | Attending: Obstetrics & Gynecology | Admitting: Obstetrics & Gynecology

## 2017-03-28 ENCOUNTER — Other Ambulatory Visit: Payer: Self-pay | Admitting: Obstetrics & Gynecology

## 2017-03-28 ENCOUNTER — Ambulatory Visit
Admission: RE | Admit: 2017-03-28 | Discharge: 2017-03-28 | Disposition: A | Discharge status: Home / Self Care | Payer: BLUE CROSS/BLUE SHIELD | Source: Ambulatory Visit | Attending: Obstetrics & Gynecology | Admitting: Obstetrics & Gynecology

## 2017-03-28 DIAGNOSIS — R928 Other abnormal and inconclusive findings on diagnostic imaging of breast: Secondary | ICD-10-CM

## 2017-03-28 DIAGNOSIS — N6310 Unspecified lump in the right breast, unspecified quadrant: Secondary | ICD-10-CM

## 2017-05-07 ENCOUNTER — Ambulatory Visit: Payer: 59 | Admitting: Internal Medicine

## 2017-06-04 ENCOUNTER — Encounter: Payer: Self-pay | Admitting: Obstetrics & Gynecology

## 2017-09-26 ENCOUNTER — Other Ambulatory Visit: Payer: Self-pay | Admitting: Obstetrics & Gynecology

## 2017-09-26 ENCOUNTER — Ambulatory Visit
Admission: RE | Admit: 2017-09-26 | Discharge: 2017-09-26 | Disposition: A | Discharge status: Home / Self Care | Payer: BLUE CROSS/BLUE SHIELD | Source: Ambulatory Visit | Attending: Obstetrics & Gynecology | Admitting: Obstetrics & Gynecology

## 2017-09-26 DIAGNOSIS — R922 Inconclusive mammogram: Secondary | ICD-10-CM | POA: Diagnosis not present

## 2017-09-26 DIAGNOSIS — Z1231 Encounter for screening mammogram for malignant neoplasm of breast: Secondary | ICD-10-CM

## 2017-09-26 DIAGNOSIS — N6310 Unspecified lump in the right breast, unspecified quadrant: Secondary | ICD-10-CM | POA: Diagnosis not present

## 2017-09-26 DIAGNOSIS — N63 Unspecified lump in unspecified breast: Secondary | ICD-10-CM

## 2018-02-27 HISTORY — PX: BREAST BIOPSY: SHX20

## 2018-03-28 ENCOUNTER — Other Ambulatory Visit: Payer: Self-pay | Admitting: Obstetrics & Gynecology

## 2018-03-28 DIAGNOSIS — N63 Unspecified lump in unspecified breast: Secondary | ICD-10-CM

## 2018-03-29 ENCOUNTER — Other Ambulatory Visit (HOSPITAL_COMMUNITY)
Admission: RE | Admit: 2018-03-29 | Discharge: 2018-03-29 | Disposition: A | Discharge status: Home / Self Care | Payer: BLUE CROSS/BLUE SHIELD | Source: Ambulatory Visit | Attending: Obstetrics & Gynecology | Admitting: Obstetrics & Gynecology

## 2018-03-29 ENCOUNTER — Ambulatory Visit (INDEPENDENT_AMBULATORY_CARE_PROVIDER_SITE_OTHER): Payer: BLUE CROSS/BLUE SHIELD | Admitting: Obstetrics & Gynecology

## 2018-03-29 ENCOUNTER — Encounter: Payer: Self-pay | Admitting: Obstetrics & Gynecology

## 2018-03-29 ENCOUNTER — Other Ambulatory Visit: Payer: Self-pay

## 2018-03-29 VITALS — BP 146/78 | HR 88 | Resp 16 | Ht 65.25 in | Wt 156.6 lb

## 2018-03-29 DIAGNOSIS — Z124 Encounter for screening for malignant neoplasm of cervix: Secondary | ICD-10-CM | POA: Insufficient documentation

## 2018-03-29 DIAGNOSIS — Z0000 Encounter for general adult medical examination without abnormal findings: Secondary | ICD-10-CM

## 2018-03-29 DIAGNOSIS — Z01419 Encounter for gynecological examination (general) (routine) without abnormal findings: Secondary | ICD-10-CM | POA: Diagnosis not present

## 2018-03-29 MED ORDER — ZOLPIDEM TARTRATE 5 MG PO TABS: 5.0000 mg | ORAL_TABLET | Freq: Every evening | ORAL | 2 refills | Status: DC | PRN

## 2018-03-29 NOTE — Progress Notes (Signed)
63 y.o. G75P0010 Married White or Caucasian female here for annual exam.  Doing well.  Denies vaginal bleeding.   Patient's last menstrual period was 01/11/2014 (exact date).          Sexually active: Yes.    The current method of family planning is post menopausal status.    Exercising: No.   Smoker:  yes  Health Maintenance: Pap:  09/14/14 Neg. HR HPV:neg  History of abnormal Pap:  no MMG:  09/26/17 [place] right BIRADS3:Benign. Has appt 04/01/18 Colonoscopy:  09/13/11 BMD:  02/01/2016 Normal TDaP:  2012 Pneumonia vaccine(s):  2017 Shingrix:   Zostavax 20130.Hep C testing: 09/27/15 neg  Screening Labs: will come back fasting    reports that she has been smoking cigarettes. She has been smoking about 1.00 pack per day. She has never used smokeless tobacco. She reports current alcohol use of about 12.0 standard drinks of alcohol per week. She reports that she does not use drugs.  Past Medical History:  Diagnosis Date  . Anemia, iron deficiency   . Celiac sprue    found with EGD biopsy done 09/13/2011  . Constipation   . Gastric polyps   . GERD (gastroesophageal reflux disease)   . Hiatal hernia   . Insomnia   . Internal hemorrhoids   . Seasonal allergies   . Tubular adenoma of colon 09/13/2011   removed from the decending colon    Past Surgical History:  Procedure Laterality Date  . COLONOSCOPY W/ BIOPSIES  09/13/2011  . ESOPHAGOGASTRODUODENOSCOPY  09/13/2011  . FOOT SURGERY Right 12/15/2011    Current Outpatient Medications  Medication Sig Dispense Refill  . omeprazole (PRILOSEC) 20 MG capsule Take 20 mg by mouth daily.    Marland Kitchen zolpidem (AMBIEN) 5 MG tablet Take 1 tablet (5 mg total) at bedtime as needed by mouth for sleep. 30 tablet 2   No current facility-administered medications for this visit.     Family History  Problem Relation Age of Onset  . Heart attack Mother   . Hypertension Mother   . Cancer Paternal Grandmother        unsure type  . Stroke Paternal Grandfather    . Breast cancer Neg Hx     Review of Systems  HENT: Positive for congestion.   Gastrointestinal: Positive for abdominal distention.  All other systems reviewed and are negative.   Exam:   BP (!) 146/78 (BP Location: Left Arm, Patient Position: Sitting, Cuff Size: Large)   Pulse 88   Resp 16   Ht 5' 5.25" (1.657 m)   Wt 156 lb 9.6 oz (71 kg)   LMP 01/11/2014 (Exact Date)   BMI 25.86 kg/m      Height: 5' 5.25" (165.7 cm)  Ht Readings from Last 3 Encounters:  03/29/18 5' 5.25" (1.657 m)  01/11/17 5\' 5"  (1.651 m)  11/09/15 5' 5.25" (1.657 m)   General appearance: alert, cooperative and appears stated age Head: Normocephalic, without obvious abnormality, atraumatic Neck: no adenopathy, supple, symmetrical, trachea midline and thyroid normal to inspection and palpation Lungs: clear to auscultation bilaterally Breasts: normal appearance, no masses or tenderness Heart: regular rate and rhythm Abdomen: soft, non-tender; bowel sounds normal; no masses,  no organomegaly Extremities: extremities normal, atraumatic, no cyanosis or edema Skin: Skin color, texture, turgor normal. No rashes or lesions Lymph nodes: Cervical, supraclavicular, and axillary nodes normal. No abnormal inguinal nodes palpated Neurologic: Grossly normal   Pelvic: External genitalia:  no lesions  Urethra:  normal appearing urethra with no masses, tenderness or lesions              Bartholins and Skenes: normal                 Vagina: normal appearing vagina with normal color and discharge, no lesions              Cervix: no lesions              Pap taken: Yes.   Bimanual Exam:  Uterus:  normal size, contour, position, consistency, mobility, non-tender              Adnexa: normal adnexa and no mass, fullness, tenderness               Rectovaginal: Confirms               Anus:  normal sphincter tone, no lesions  Chaperone was present for exam.  A:  Well Woman with normal exam PMP, no  HRT Insomnia Urethra cyst, stable  P:   Mammogram guidelines reviewed pap smear and HR HPV obtained today CBC, CMP, Lipids, HbA1C, TSH and Vit D ordered.  She is going to return for fasting lab work Information about PCPs given Release of colonoscopy and pathology will be signed today to make sure we both know when follow up colonoscopy is due return annually or prn

## 2018-03-29 NOTE — Patient Instructions (Signed)
[facility]  Address: [address], [place], [place] [address]  Phone: [phone]

## 2018-04-01 ENCOUNTER — Ambulatory Visit
Admission: RE | Admit: 2018-04-01 | Discharge: 2018-04-01 | Disposition: A | Discharge status: Home / Self Care | Payer: BLUE CROSS/BLUE SHIELD | Source: Ambulatory Visit | Attending: Obstetrics & Gynecology | Admitting: Obstetrics & Gynecology

## 2018-04-01 ENCOUNTER — Other Ambulatory Visit: Payer: BLUE CROSS/BLUE SHIELD

## 2018-04-01 ENCOUNTER — Other Ambulatory Visit: Payer: Self-pay | Admitting: Obstetrics & Gynecology

## 2018-04-01 DIAGNOSIS — N63 Unspecified lump in unspecified breast: Secondary | ICD-10-CM

## 2018-04-01 DIAGNOSIS — R922 Inconclusive mammogram: Secondary | ICD-10-CM | POA: Diagnosis not present

## 2018-04-01 DIAGNOSIS — N6310 Unspecified lump in the right breast, unspecified quadrant: Secondary | ICD-10-CM | POA: Diagnosis not present

## 2018-04-01 DIAGNOSIS — Z0000 Encounter for general adult medical examination without abnormal findings: Secondary | ICD-10-CM

## 2018-04-01 DIAGNOSIS — R921 Mammographic calcification found on diagnostic imaging of breast: Secondary | ICD-10-CM

## 2018-04-02 LAB — LIPID PANEL
Chol/HDL Ratio: 4.3 ratio (ref 0.0–4.4)
Cholesterol, Total: 243 mg/dL — ABNORMAL HIGH (ref 100–199)
HDL: 56 mg/dL (ref >39)
LDL Calculated: 151 mg/dL — ABNORMAL HIGH (ref 0–99)
Triglycerides: 179 mg/dL — ABNORMAL HIGH (ref 0–149)
VLDL Cholesterol Cal: 36 mg/dL (ref 5–40)

## 2018-04-02 LAB — CYTOLOGY - PAP
Diagnosis: NEGATIVE
HPV: NOT DETECTED

## 2018-04-02 LAB — CBC
Hematocrit: 41.5 % (ref 34.0–46.6)
Hemoglobin: 14.8 g/dL (ref 11.1–15.9)
MCH: 34.9 pg — ABNORMAL HIGH (ref 26.6–33.0)
MCHC: 35.7 g/dL (ref 31.5–35.7)
MCV: 98 fL — ABNORMAL HIGH (ref 79–97)
Platelets: 203 10*3/uL (ref 150–450)
RBC: 4.24 x10E6/uL (ref 3.77–5.28)
RDW: 12.9 % (ref 11.7–15.4)
WBC: 6.3 10*3/uL (ref 3.4–10.8)

## 2018-04-02 LAB — HEMOGLOBIN A1C
Est. average glucose Bld gHb Est-mCnc: 120 mg/dL
Hgb A1c MFr Bld: 5.8 % — ABNORMAL HIGH (ref 4.8–5.6)

## 2018-04-02 LAB — TSH: TSH: 2.09 u[IU]/mL (ref 0.450–4.500)

## 2018-04-02 LAB — COMPREHENSIVE METABOLIC PANEL
ALT: 10 IU/L (ref 0–32)
AST: 15 IU/L (ref 0–40)
Albumin/Globulin Ratio: 1.8 (ref 1.2–2.2)
Albumin: 4.4 g/dL (ref 3.8–4.8)
Alkaline Phosphatase: 82 IU/L (ref 39–117)
BUN/Creatinine Ratio: 13 (ref 12–28)
BUN: 9 mg/dL (ref 8–27)
Bilirubin Total: 0.3 mg/dL (ref 0.0–1.2)
CO2: 23 mmol/L (ref 20–29)
Calcium: 9.5 mg/dL (ref 8.7–10.3)
Chloride: 104 mmol/L (ref 96–106)
Creatinine, Ser: 0.72 mg/dL (ref 0.57–1.00)
GFR calc Af Amer: 105 mL/min/{1.73_m2} (ref >59)
GFR calc non Af Amer: 91 mL/min/{1.73_m2} (ref >59)
Globulin, Total: 2.5 g/dL (ref 1.5–4.5)
Glucose: 105 mg/dL — ABNORMAL HIGH (ref 65–99)
Potassium: 4.4 mmol/L (ref 3.5–5.2)
Sodium: 142 mmol/L (ref 134–144)
Total Protein: 6.9 g/dL (ref 6.0–8.5)

## 2018-04-02 LAB — VITAMIN D 25 HYDROXY (VIT D DEFICIENCY, FRACTURES): Vit D, 25-Hydroxy: 15 ng/mL — ABNORMAL LOW (ref 30.0–100.0)

## 2018-04-08 ENCOUNTER — Ambulatory Visit
Admission: RE | Admit: 2018-04-08 | Discharge: 2018-04-08 | Disposition: A | Discharge status: Home / Self Care | Payer: BLUE CROSS/BLUE SHIELD | Source: Ambulatory Visit | Attending: Obstetrics & Gynecology | Admitting: Obstetrics & Gynecology

## 2018-04-08 ENCOUNTER — Other Ambulatory Visit: Payer: Self-pay | Admitting: *Deleted

## 2018-04-08 DIAGNOSIS — R921 Mammographic calcification found on diagnostic imaging of breast: Secondary | ICD-10-CM

## 2018-04-08 DIAGNOSIS — N6012 Diffuse cystic mastopathy of left breast: Secondary | ICD-10-CM | POA: Diagnosis not present

## 2018-04-08 MED ORDER — VITAMIN D (ERGOCALCIFEROL) 1.25 MG (50000 UNIT) PO CAPS: 50000.0000 [IU] | ORAL_CAPSULE | ORAL | 0 refills | Status: DC

## 2018-05-01 ENCOUNTER — Encounter: Payer: Self-pay | Admitting: Physician Assistant

## 2018-05-01 ENCOUNTER — Ambulatory Visit (INDEPENDENT_AMBULATORY_CARE_PROVIDER_SITE_OTHER): Payer: BLUE CROSS/BLUE SHIELD | Admitting: Physician Assistant

## 2018-05-01 ENCOUNTER — Other Ambulatory Visit: Payer: Self-pay

## 2018-05-01 VITALS — BP 122/86 | HR 70 | Temp 98.2°F | Resp 14 | Ht 65.0 in | Wt 156.0 lb

## 2018-05-01 DIAGNOSIS — F17200 Nicotine dependence, unspecified, uncomplicated: Secondary | ICD-10-CM | POA: Diagnosis not present

## 2018-05-01 DIAGNOSIS — K219 Gastro-esophageal reflux disease without esophagitis: Secondary | ICD-10-CM | POA: Diagnosis not present

## 2018-05-01 MED ORDER — VARENICLINE TARTRATE 0.5 MG X 11 & 1 MG X 42 PO MISC: ORAL | 0 refills | Status: DC

## 2018-05-01 MED ORDER — PANTOPRAZOLE SODIUM 20 MG PO TBEC: 20.0000 mg | DELAYED_RELEASE_TABLET | Freq: Every day | ORAL | 3 refills | Status: DC

## 2018-05-01 NOTE — Progress Notes (Signed)
Patient presents to clinic today to establish care.  Diet -- Pretty well-balanced. Cooks at home always. . Exercise -- No regular regimen currently.    Acute Concerns: Denies acute concerns.  Chronic Issues: Tobacco Use Disorder -- 1 ppd for 20 years. Has tried Chantix before but notes she had a lot going on at the time. Tried the gum and losenge before -- does not help. Ready to quit.   Insomnia -- On occasion. Takes Zolpidem on very rare occasion when symptoms are significant. Has been managed by GYN..   GERD -- With hiatal Hernia. Prilosec 20 mg  daily x years. Helps but still noting breakthrough reflux. Can tell if she misses a dose. Denies upper abomidnal pain, nausea/vomiting. Denies cough or globus sensation.  No difficulty  Dexilant -- before which helped.   Health Maintenance: Immunizations -- Declines flu shot. Tetanus Colonoscopy -- up-to-date. Mammogram -- up-to-date PAP -- up-to-date.  Past Medical History:  Diagnosis Date  . Allergy   . Anemia, iron deficiency   . Celiac sprue    found with EGD biopsy done 09/13/2011  . Constipation   . Gastric polyps   . GERD (gastroesophageal reflux disease)   . Hiatal hernia   . History of chickenpox   . Insomnia   . Internal hemorrhoids   . Seasonal allergies   . Tubular adenoma of colon 09/13/2011   removed from the decending colon    Past Surgical History:  Procedure Laterality Date  . COLONOSCOPY W/ BIOPSIES  09/13/2011  . ESOPHAGOGASTRODUODENOSCOPY  09/13/2011  . FOOT SURGERY Right 12/15/2011   Bunionectomy  . TONSILLECTOMY  1964    Current Outpatient Medications on File Prior to Visit  Medication Sig Dispense Refill  . Vitamin D, Ergocalciferol, (DRISDOL) 1.25 MG (50000 UT) CAPS capsule Take 1 capsule (50,000 Units total) by mouth every 7 (seven) days. 12 capsule 0  . zolpidem (AMBIEN) 5 MG tablet Take 1 tablet (5 mg total) by mouth at bedtime as needed for sleep. 30 tablet 2   No current  facility-administered medications on file prior to visit.     No Known Allergies  Family History  Problem Relation Age of Onset  . Heart attack Mother   . Hypertension Mother   . Cancer Mother        Lung  . Hearing loss Father   . Drug abuse Brother   . Cancer Paternal Grandmother        unsure type  . Stroke Paternal Grandfather   . Intellectual disability Paternal Aunt   . Breast cancer Neg Hx     Social History   Socioeconomic History  . Marital status: Married    Spouse name: Not on file  . Number of children: Not on file  . Years of education: Not on file  . Highest education level: Not on file  Occupational History  . Not on file  Social Needs  . Financial resource strain: Not on file  . Food insecurity:    Worry: Not on file    Inability: Not on file  . Transportation needs:    Medical: Not on file    Non-medical: Not on file  Tobacco Use  . Smoking status: Current Every Day Smoker    Packs/day: 1.00    Years: 20.00    Pack years: 20.00    Types: Cigarettes  . Smokeless tobacco: Never Used  Substance and Sexual Activity  . Alcohol use: Yes    Alcohol/week: 12.0 standard drinks  Types: 12 Standard drinks or equivalent per week  . Drug use: No  . Sexual activity: Yes    Partners: Male    Birth control/protection: Surgical, Post-menopausal    Comment: vasectomy  Lifestyle  . Physical activity:    Days per week: Not on file    Minutes per session: Not on file  . Stress: Not on file  Relationships  . Social connections:    Talks on phone: Not on file    Gets together: Not on file    Attends religious service: Not on file    Active member of club or organization: Not on file    Attends meetings of clubs or organizations: Not on file    Relationship status: Not on file  . Intimate partner violence:    Fear of current or ex partner: Not on file    Emotionally abused: Not on file    Physically abused: Not on file    Forced sexual activity: Not  on file  Other Topics Concern  . Not on file  Social History Narrative  . Not on file   Review of Systems  Constitutional: Negative for fever and weight loss.  HENT: Negative for ear discharge, ear pain, hearing loss and tinnitus.   Eyes: Negative for blurred vision, double vision, photophobia and pain.  Respiratory: Negative for cough and shortness of breath.   Cardiovascular: Negative for chest pain and palpitations.  Gastrointestinal: Positive for heartburn. Negative for abdominal pain, blood in stool, constipation, diarrhea, melena, nausea and vomiting.  Genitourinary: Negative for dysuria, flank pain, frequency, hematuria and urgency.  Musculoskeletal: Negative for falls.  Neurological: Negative for dizziness, loss of consciousness and headaches.  Endo/Heme/Allergies: Negative for environmental allergies.  Psychiatric/Behavioral: Negative for depression, hallucinations, substance abuse and suicidal ideas. The patient is not nervous/anxious and does not have insomnia.    BP 122/86   Pulse 70   Temp 98.2 F (36.8 C) (Oral)   Resp 14   Ht 5\' 5"  (1.651 m)   Wt 156 lb (70.8 kg)   LMP 01/11/2014 (Exact Date)   SpO2 98%   BMI 25.96 kg/m   Physical Exam Vitals signs reviewed.  Constitutional:      Appearance: Normal appearance.  HENT:     Head: Normocephalic and atraumatic.     Right Ear: Tympanic membrane normal.     Left Ear: Tympanic membrane normal.     Nose: Nose normal.     Mouth/Throat:     Mouth: Mucous membranes are moist.  Eyes:     Conjunctiva/sclera: Conjunctivae normal.  Neck:     Musculoskeletal: Neck supple.  Cardiovascular:     Rate and Rhythm: Normal rate and regular rhythm.     Pulses: Normal pulses.  Pulmonary:     Effort: Pulmonary effort is normal.     Breath sounds: Normal breath sounds.  Neurological:     General: No focal deficit present.     Mental Status: She is alert and oriented to person, place, and time.  Psychiatric:        Mood and  Affect: Mood normal.    Recent Results (from the past 2160 hour(s))  Cytology - PAP( [place])     Status: None   Collection Time: 03/29/18 12:00 AM  Result Value Ref Range   Adequacy      Satisfactory for evaluation  endocervical/transformation zone component PRESENT.   Diagnosis      NEGATIVE FOR INTRAEPITHELIAL LESIONS OR MALIGNANCY.   HPV  NOT DETECTED     Comment: Normal Reference Range - NOT Detected   Material Submitted CervicoVaginal Pap [ThinPrep Imaged]    CYTOLOGY - PAP PAP RESULT   Hemoglobin A1c     Status: Abnormal   Collection Time: 04/01/18  8:31 AM  Result Value Ref Range   Hgb A1c MFr Bld 5.8 (H) 4.8 - 5.6 %    Comment:          Prediabetes: 5.7 - 6.4          Diabetes: >6.4          Glycemic control for adults with diabetes: <7.0    Est. average glucose Bld gHb Est-mCnc 120 mg/dL  VITAMIN D 25 Hydroxy (Vit-D Deficiency, Fractures)     Status: Abnormal   Collection Time: 04/01/18  8:31 AM  Result Value Ref Range   Vit D, 25-Hydroxy 15.0 (L) 30.0 - 100.0 ng/mL    Comment: Vitamin D deficiency has been defined by the [place] and an Endocrine Society practice guideline as a level of serum 25-OH vitamin D less than 20 ng/mL (1,2). The Endocrine Society went on to further define vitamin D insufficiency as a level between 21 and 29 ng/mL (2). 1. IOM (Institute of Medicine). 2010. Dietary reference    intakes for calcium and D. [place]: The    [employer]. 2. Holick MF, Binkley [place], Bischoff-Ferrari HA, et al.    Evaluation, treatment, and prevention of vitamin D    deficiency: an Endocrine Society clinical practice    guideline. JCEM. 2011 Jul; 96(7):1911-30.   TSH     Status: None   Collection Time: 04/01/18  8:31 AM  Result Value Ref Range   TSH 2.090 0.450 - 4.500 uIU/mL  Lipid panel     Status: Abnormal   Collection Time: 04/01/18  8:31 AM  Result Value Ref Range   Cholesterol, Total 243 (H) 100 - 199 mg/dL    Triglycerides 179 (H) 0 - 149 mg/dL   HDL 56 >39 mg/dL   VLDL Cholesterol Cal 36 5 - 40 mg/dL   LDL Calculated 151 (H) 0 - 99 mg/dL   Chol/HDL Ratio 4.3 0.0 - 4.4 ratio    Comment:                                   T. Chol/HDL Ratio                                             Men  Women                               1/2 Avg.Risk  3.4    3.3                                   Avg.Risk  5.0    4.4                                2X Avg.Risk  9.6    7.1  3X Avg.Risk 23.4   11.0   Comprehensive metabolic panel     Status: Abnormal   Collection Time: 04/01/18  8:31 AM  Result Value Ref Range   Glucose 105 (H) 65 - 99 mg/dL   BUN 9 8 - 27 mg/dL   Creatinine, Ser 0.72 0.57 - 1.00 mg/dL   GFR calc non Af Amer 91 >59 mL/min/1.73   GFR calc Af Amer 105 >59 mL/min/1.73   BUN/Creatinine Ratio 13 12 - 28   Sodium 142 134 - 144 mmol/L   Potassium 4.4 3.5 - 5.2 mmol/L   Chloride 104 96 - 106 mmol/L   CO2 23 20 - 29 mmol/L   Calcium 9.5 8.7 - 10.3 mg/dL   Total Protein 6.9 6.0 - 8.5 g/dL   Albumin 4.4 3.8 - 4.8 g/dL    Comment:               **Please note reference interval change**   Globulin, Total 2.5 1.5 - 4.5 g/dL   Albumin/Globulin Ratio 1.8 1.2 - 2.2   Bilirubin Total 0.3 0.0 - 1.2 mg/dL   Alkaline Phosphatase 82 39 - 117 IU/L   AST 15 0 - 40 IU/L   ALT 10 0 - 32 IU/L  CBC     Status: Abnormal   Collection Time: 04/01/18  8:31 AM  Result Value Ref Range   WBC 6.3 3.4 - 10.8 x10E3/uL   RBC 4.24 3.77 - 5.28 x10E6/uL   Hemoglobin 14.8 11.1 - 15.9 g/dL   Hematocrit 41.5 34.0 - 46.6 %   MCV 98 (H) 79 - 97 fL   MCH 34.9 (H) 26.6 - 33.0 pg   MCHC 35.7 31.5 - 35.7 g/dL   RDW 12.9 11.7 - 15.4 %   Platelets 203 150 - 450 x10E3/uL   Assessment/Plan: Chronic GERD Still noting breakthrough reflux with her omeprazole. Has pending appointment with new GI provider due to history. No alarm signs/symptoms. Will have her continue GERD diet. Will switch to a trial of  Pantoprazole 40 mg.   Tobacco use disorder Asymptomatic. Will start Chantix. Follow-up scheduled.     Tara Rio, PA-C

## 2018-05-01 NOTE — Assessment & Plan Note (Signed)
Still noting breakthrough reflux with her omeprazole. Has pending appointment with new GI provider due to history. No alarm signs/symptoms. Will have her continue GERD diet. Will switch to a trial of Pantoprazole 40 mg.

## 2018-05-01 NOTE — Patient Instructions (Signed)
Please stop the Omeprazole and Start the Pantoprazole for 2 weeks. Follow the acid reflux diet listed below.  Start the Chantix taking as directed.  We will follow-up in 2-3 weeks for GERD.   Food Choices for Gastroesophageal Reflux Disease, Adult When you have gastroesophageal reflux disease (GERD), the foods you eat and your eating habits are very important. Choosing the right foods can help ease your discomfort. Think about working with a nutrition specialist (dietitian) to help you make good choices. What are tips for following this plan?  Meals  Choose healthy foods that are low in fat, such as fruits, vegetables, whole grains, low-fat dairy products, and lean meat, fish, and poultry.  Eat small meals often instead of 3 large meals a day. Eat your meals slowly, and in a place where you are relaxed. Avoid bending over or lying down until 2-3 hours after eating.  Avoid eating meals 2-3 hours before bed.  Avoid drinking a lot of liquid with meals.  Cook foods using methods other than frying. Bake, grill, or broil food instead.  Avoid or limit: ? Chocolate. ? Peppermint or spearmint. ? Alcohol. ? Pepper. ? Black and decaffeinated coffee. ? Black and decaffeinated tea. ? Bubbly (carbonated) soft drinks. ? Caffeinated energy drinks and soft drinks.  Limit high-fat foods such as: ? Fatty meat or fried foods. ? Whole milk, cream, butter, or ice cream. ? Nuts and nut butters. ? Pastries, donuts, and sweets made with butter or shortening.  Avoid foods that cause symptoms. These foods may be different for everyone. Common foods that cause symptoms include: ? Tomatoes. ? Oranges, lemons, and limes. ? Peppers. ? Spicy food. ? Onions and garlic. ? Vinegar. Lifestyle  Maintain a healthy weight. Ask your doctor what weight is healthy for you. If you need to lose weight, work with your doctor to do so safely.  Exercise for at least 30 minutes for 5 or more days each week, or as  told by your doctor.  Wear loose-fitting clothes.  Do not smoke. If you need help quitting, ask your doctor.  Sleep with the head of your bed higher than your feet. Use a wedge under the mattress or blocks under the bed frame to raise the head of the bed. Summary  When you have gastroesophageal reflux disease (GERD), food and lifestyle choices are very important in easing your symptoms.  Eat small meals often instead of 3 large meals a day. Eat your meals slowly, and in a place where you are relaxed.  Limit high-fat foods such as fatty meat or fried foods.  Avoid bending over or lying down until 2-3 hours after eating.  Avoid peppermint and spearmint, caffeine, alcohol, and chocolate. This information is not intended to replace advice given to you by your health care provider. Make sure you discuss any questions you have with your health care provider. Document Released: 08/15/2011 Document Revised: 03/21/2016 Document Reviewed: 03/21/2016 Elsevier Interactive Patient Education  2019 [employer].

## 2018-05-01 NOTE — Assessment & Plan Note (Signed)
Asymptomatic. Will start Chantix. Follow-up scheduled.

## 2018-05-23 ENCOUNTER — Other Ambulatory Visit: Payer: Self-pay | Admitting: Physician Assistant

## 2018-05-23 DIAGNOSIS — F17200 Nicotine dependence, unspecified, uncomplicated: Secondary | ICD-10-CM

## 2018-05-23 MED ORDER — VARENICLINE TARTRATE 0.5 MG X 11 & 1 MG X 42 PO MISC: ORAL | 0 refills | Status: DC

## 2018-05-24 ENCOUNTER — Other Ambulatory Visit: Payer: Self-pay | Admitting: Physician Assistant

## 2018-05-24 DIAGNOSIS — F17200 Nicotine dependence, unspecified, uncomplicated: Secondary | ICD-10-CM

## 2018-05-24 MED ORDER — VARENICLINE TARTRATE 1 MG PO TABS: 1.0000 mg | ORAL_TABLET | Freq: Two times a day (BID) | ORAL | 1 refills | Status: DC

## 2018-06-16 ENCOUNTER — Other Ambulatory Visit: Payer: Self-pay | Admitting: Physician Assistant

## 2018-06-16 DIAGNOSIS — F17200 Nicotine dependence, unspecified, uncomplicated: Secondary | ICD-10-CM

## 2018-06-24 ENCOUNTER — Ambulatory Visit (INDEPENDENT_AMBULATORY_CARE_PROVIDER_SITE_OTHER): Payer: BLUE CROSS/BLUE SHIELD | Admitting: Physician Assistant

## 2018-06-24 ENCOUNTER — Encounter: Payer: Self-pay | Admitting: Physician Assistant

## 2018-06-24 ENCOUNTER — Other Ambulatory Visit: Payer: Self-pay

## 2018-06-24 ENCOUNTER — Other Ambulatory Visit: Payer: Self-pay | Admitting: *Deleted

## 2018-06-24 DIAGNOSIS — K219 Gastro-esophageal reflux disease without esophagitis: Secondary | ICD-10-CM

## 2018-06-24 DIAGNOSIS — F17200 Nicotine dependence, unspecified, uncomplicated: Secondary | ICD-10-CM

## 2018-06-24 MED ORDER — VITAMIN D (ERGOCALCIFEROL) 1.25 MG (50000 UNIT) PO CAPS: 50000.0000 [IU] | ORAL_CAPSULE | ORAL | 0 refills | Status: DC

## 2018-06-24 MED ORDER — DEXLANSOPRAZOLE 30 MG PO CPDR: 30.0000 mg | DELAYED_RELEASE_CAPSULE | Freq: Every day | ORAL | 3 refills | Status: DC

## 2018-06-24 NOTE — Progress Notes (Signed)
I have discussed the procedure for the virtual visit with the patient who has given consent to proceed with assessment and treatment.   Tara Hendrix Tara Hendrix, CMA     

## 2018-06-24 NOTE — Telephone Encounter (Signed)
Overdue for Follow-up for GERD. Have her schedule Video Visit so we can discuss.

## 2018-06-24 NOTE — Addendum Note (Signed)
Addended by: Brunetta Jeans on: 06/24/2018 03:07 PM   Modules accepted: Orders

## 2018-06-24 NOTE — Telephone Encounter (Signed)
Medication refill request: Vit D Last AEX:  03/29/18 SM Next AEX: 08/08/19 Last MMG (if hormonal medication request): Left Breast Bx 04/08/18. Fibrocystic changes with calcifications.  Refill authorized: 04/08/18 #[id number]. Today please advise.

## 2018-06-24 NOTE — Assessment & Plan Note (Signed)
Doing well with the Chantix. Has set quit date of May 15th. Will follow-up with her at this time to see how she is doing.

## 2018-06-24 NOTE — Progress Notes (Signed)
   Virtual Visit via Video   I connected with Tara Hendrix on 06/24/18 at  2:30 PM EDT by a video enabled telemedicine application and verified that I am speaking with the correct person using two identifiers.  Location Tara Hendrix: Home Location provider: [facility], Office Persons participating in the virtual visit: Tara Hendrix, Provider, [place] (Patina Moore)  I discussed the limitations of evaluation and management by telemedicine and the availability of in person appointments. The Tara Hendrix expressed understanding and agreed to proceed.  Subjective:   HPI:   Tara Hendrix presents via Doxy.Me to follow-up regarding GERD symptoms. At last visit, Tara Hendrix was noting breakthrough symptoms with her 20 mg daily Prilosec. As such she was started on Protonix 40 mg daily. Tara Hendrix endorses taking medication as directed as well as trying to avoid trigger foods. Notes medication is helping but is still having breakthrough a couple of times per week. H2 blockers have not been helpful previously. Notes she was on Dexliant previously with great success. Wants to know if this is an option.  In regards to Tara Hendrix's tobacco use, Tara Hendrix notes she is taking her Chantix as directed and tolerating well. Has noted significant reduction in her cravings. Has set a quit date of May15th and notes she is almost there in terms of cigarettes/day.   ROS:   See pertinent positives and negatives per HPI.  Tara Hendrix Active Problem List   Diagnosis Date Noted  . Chronic GERD 05/01/2018  . Tobacco use disorder 05/01/2018  . GERD (gastroesophageal reflux disease)     Social History   Tobacco Use  . Smoking status: Current Every Day Smoker    Packs/day: 1.00    Years: 20.00    Pack years: 20.00    Types: Cigarettes  . Smokeless tobacco: Never Used  Substance Use Topics  . Alcohol use: Yes    Alcohol/week: 12.0 standard drinks    Types: 12 Standard drinks or equivalent per week    Current Outpatient Medications:  .   CHANTIX CONTINUING MONTH PAK 1 MG tablet, TAKE 1 TABLET BY MOUTH TWICE A DAY, Disp: 56 tablet, Rfl: 1 .  Cholecalciferol (VITAMIN D3) 50 MCG (2000 UT) TABS, Take 1 tablet by mouth daily., Disp: , Rfl:  .  pantoprazole (PROTONIX) 20 MG tablet, Take 1 tablet (20 mg total) by mouth daily. (Tara Hendrix taking differently: Take 20 mg by mouth every morning. ), Disp: 30 tablet, Rfl: 3 .  zolpidem (AMBIEN) 5 MG tablet, Take 1 tablet (5 mg total) by mouth at bedtime as needed for sleep., Disp: 30 tablet, Rfl: 2  No Known Allergies  Objective:   LMP 01/11/2014 (Exact Date)   Tara Hendrix is well-developed, well-nourished in no acute distress.  Resting comfortably in chair at home.  Head is normocephalic, atraumatic.  No labored breathing.  Speech is clear and coherent with logical contest.  Tara Hendrix is alert and oriented at baseline.   Assessment and Plan:        Chronic GERD Still having some breakthrough with Protonix. Will try to get Dexilant 30 mg approved for daily use. If not, will try to break her Pantoprazole into BID dosing giving most breakthrough symptoms are in the evenings. Continue with GERD diet. Will continue with smoking cessation.  Tobacco use disorder Doing well with the Chantix. Has set quit date of May 15th. Will follow-up with her at this time to see how she is doing.       Leeanne Rio, PA-C 06/24/2018

## 2018-06-24 NOTE — Assessment & Plan Note (Signed)
Still having some breakthrough with Protonix. Will try to get Dexilant 30 mg approved for daily use. If not, will try to break her Pantoprazole into BID dosing giving most breakthrough symptoms are in the evenings. Continue with GERD diet. Will continue with smoking cessation.

## 2018-07-10 ENCOUNTER — Other Ambulatory Visit: Payer: Self-pay | Admitting: Physician Assistant

## 2018-07-10 DIAGNOSIS — F17200 Nicotine dependence, unspecified, uncomplicated: Secondary | ICD-10-CM

## 2018-07-31 ENCOUNTER — Encounter: Payer: Self-pay | Admitting: Emergency Medicine

## 2018-07-31 ENCOUNTER — Other Ambulatory Visit: Payer: Self-pay | Admitting: Physician Assistant

## 2018-07-31 DIAGNOSIS — F17200 Nicotine dependence, unspecified, uncomplicated: Secondary | ICD-10-CM

## 2018-07-31 NOTE — Telephone Encounter (Signed)
My chart message sent to patient to see how she is doing with the smoking cessation.

## 2018-08-01 ENCOUNTER — Encounter: Payer: Self-pay | Admitting: Physician Assistant

## 2018-08-17 ENCOUNTER — Other Ambulatory Visit: Payer: Self-pay | Admitting: Physician Assistant

## 2018-08-17 DIAGNOSIS — K219 Gastro-esophageal reflux disease without esophagitis: Secondary | ICD-10-CM

## 2018-08-26 ENCOUNTER — Other Ambulatory Visit: Payer: Self-pay | Admitting: Physician Assistant

## 2018-08-26 DIAGNOSIS — F17200 Nicotine dependence, unspecified, uncomplicated: Secondary | ICD-10-CM

## 2018-09-01 ENCOUNTER — Other Ambulatory Visit: Payer: Self-pay | Admitting: Physician Assistant

## 2018-09-01 DIAGNOSIS — F17200 Nicotine dependence, unspecified, uncomplicated: Secondary | ICD-10-CM

## 2018-09-08 ENCOUNTER — Other Ambulatory Visit: Payer: Self-pay | Admitting: Obstetrics & Gynecology

## 2018-09-24 ENCOUNTER — Encounter: Payer: Self-pay | Admitting: Physician Assistant

## 2018-11-12 ENCOUNTER — Encounter: Payer: Self-pay | Admitting: Physician Assistant

## 2018-11-18 ENCOUNTER — Other Ambulatory Visit: Payer: Self-pay | Admitting: Physician Assistant

## 2018-11-18 ENCOUNTER — Encounter: Payer: Self-pay | Admitting: Physician Assistant

## 2018-11-18 DIAGNOSIS — E559 Vitamin D deficiency, unspecified: Secondary | ICD-10-CM

## 2018-11-18 MED ORDER — RABEPRAZOLE SODIUM 20 MG PO TBEC: 20.0000 mg | DELAYED_RELEASE_TABLET | Freq: Every day | ORAL | 1 refills | Status: DC

## 2018-11-21 ENCOUNTER — Ambulatory Visit (INDEPENDENT_AMBULATORY_CARE_PROVIDER_SITE_OTHER): Payer: No Typology Code available for payment source

## 2018-11-21 ENCOUNTER — Other Ambulatory Visit: Payer: Self-pay

## 2018-11-21 ENCOUNTER — Telehealth: Payer: Self-pay

## 2018-11-21 DIAGNOSIS — Z23 Encounter for immunization: Secondary | ICD-10-CM

## 2018-11-21 DIAGNOSIS — E559 Vitamin D deficiency, unspecified: Secondary | ICD-10-CM

## 2018-11-21 DIAGNOSIS — K219 Gastro-esophageal reflux disease without esophagitis: Secondary | ICD-10-CM

## 2018-11-21 LAB — VITAMIN D 25 HYDROXY (VIT D DEFICIENCY, FRACTURES): VITD: 44.91 ng/mL (ref 30.00–100.00)

## 2018-11-21 NOTE — Telephone Encounter (Signed)
Patient seen today for flu shot and labs. Expressed that she would like a referral placed to see a nutritionist for dietary advice on dealing with acid reflux. Please advise

## 2018-11-21 NOTE — Telephone Encounter (Signed)
Ok to place referral.

## 2018-11-21 NOTE — Telephone Encounter (Signed)
Referral with [facility] placed

## 2018-12-13 ENCOUNTER — Other Ambulatory Visit: Payer: Self-pay | Admitting: Physician Assistant

## 2018-12-13 NOTE — Telephone Encounter (Signed)
Pt called in asking if we could send the Rabeprazole 20mg  tabs to the [facility] on [address]. Pt states that moving this medication there will save her about $65.   Pt can be reached at the home #

## 2018-12-13 NOTE — Telephone Encounter (Signed)
Ok for refill? 

## 2018-12-15 MED ORDER — RABEPRAZOLE SODIUM 20 MG PO TBEC: 20.0000 mg | DELAYED_RELEASE_TABLET | Freq: Every day | ORAL | 1 refills | Status: DC

## 2018-12-27 ENCOUNTER — Encounter: Payer: Self-pay | Admitting: Registered"

## 2018-12-27 ENCOUNTER — Encounter: Payer: No Typology Code available for payment source | Attending: Physician Assistant | Admitting: Registered"

## 2018-12-27 ENCOUNTER — Other Ambulatory Visit: Payer: Self-pay

## 2018-12-27 DIAGNOSIS — K219 Gastro-esophageal reflux disease without esophagitis: Secondary | ICD-10-CM | POA: Insufficient documentation

## 2018-12-27 NOTE — Patient Instructions (Addendum)
   For more information on FODMAPs [email or web address]  To help with GERD some suggestions are:  Eliminate black pepper, wine, raw onions  Reduce fat intake  Reduce caffeine  Eat dinner earlier  Consider sleep tips on handout  Check with your GYN to follow up on the celiac sprue noted in your medical history. I will also send her a note about our discussion

## 2018-12-27 NOTE — Progress Notes (Signed)
Medical Nutrition Therapy:  Appt start time: [id number] end time:  1140.   Assessment:  Primary concerns today: GERD  In addition to heart burn patient complains of bloating and intermittent diarrhea. Pt sates when she wakes up she is not bloated but continues to get worse as the day progresses. Pt reports sometimes after BM bloating comes on.  Pt brought in [employer] article on FODMAPs which she thought my explain the bloating. RD delayed discussion on this to first focus on GERD triggers.  Pt sates she is no longer taking Chantix, quit smoking 08/28/2018.  Relevant medical history not included in problem list includes: Hiatal hernia (patient confirms)  Celiac sprue on 09/13/2011 from EGD biopsy Pt states she was told this by the technician but then later was told she doesn't have it and no one ever follow-up with her. Pt requested that her GYN be notified so she can get direction of how to confirm whether or not she has celiac disease.  Sleep: 9-10 pm falls asleep within 30 min, within 3-4 hours wakes up and can't go back to sleep. Heartburn, dog and other things wake her up. 2x/mo uses Ambien to help sleep.  Preferred Learning Style:   Auditory  Visual  Hands on  Learning Readiness:   Ready  MEDICATIONS: reviewed   DIETARY INTAKE:  24-hr recall:  B (8:30 AM): eggs, toast, black tea no sugar Snk ( AM): none OR fig newton OR protein bar  L ( PM): left over Snk (4-5 PM): salty snacks (pretzels, nuts)  D (7:30-8:30 PM): protein (chicken, sea food, port), salad 4-5 Tbsp dressing vinaigrettes or ranch (buttermilk), vegetables butter, salt & pepper, 4x week starch Snk ( PM): none Beverages: black tea, water, red wine at night  Usual physical activity: ADLs states although has a sedentary work position, is very fidgety and gets up and moves a lot  Estimated energy needs: 1800 calories  Progress Towards Goal(s):  New goal.   Nutritional Diagnosis:  NB-1.1 Food and nutrition-related  knowledge deficit As related to trigger foods for GERD.  As evidenced by dietary recall and chronic GERD.    Intervention:  Nutrition Education. Discussed triggers in her diet that may be contributing to heart burn. Discussed elimination and reintroduction of potential triggers. Discussed the basics of FODMAPs   Plan:  For more information on FODMAPs [email or web address]  To help with GERD some suggestions are:  Eliminate black pepper, wine, raw onions  Reduce fat intake  Reduce caffeine  Eat dinner earlier  Consider sleep tips on handout  Check with your GYN to follow up on the celiac sprue noted in your medical history. I will also send her a note about our discussion   Teaching Method Utilized:  Visual Auditory  Handouts given during visit include:  Copy of her dietary recall to discuss potential triggers  Sleep Hygiene  Barriers to learning/adherence to lifestyle change: none  Demonstrated degree of understanding via:  Teach Back   Monitoring/Evaluation:  Dietary intake, exercise, GERD symptoms, sleep, and body weight prn.

## 2019-01-20 ENCOUNTER — Encounter: Payer: Self-pay | Admitting: Internal Medicine

## 2019-01-20 ENCOUNTER — Telehealth: Payer: Self-pay | Admitting: *Deleted

## 2019-01-20 DIAGNOSIS — K219 Gastro-esophageal reflux disease without esophagitis: Secondary | ICD-10-CM

## 2019-01-20 DIAGNOSIS — Z1211 Encounter for screening for malignant neoplasm of colon: Secondary | ICD-10-CM

## 2019-01-20 NOTE — Telephone Encounter (Signed)
-----   Message from Megan Salon, MD sent at 01/18/2019  7:16 AM EST ----- Regarding: GI follow up Tara Hendrix, Can you please call this pt.  I got a message from [name], registered dietician, about this pt.  She questioned whether or not the pt has celiac disease.  I reviewed Dr. [name] records and I think this was ruled out due to her biopsies being all normal.  She did have three polyps removed and these were adenomatous polyps.  She was to have follow up in 5 years which would have bene 2018.  I think she needs to consider having a repeat colonoscopy at this time, unless Dr. Collene Mares recommends otherwise.  Please just relay this information and if we need to help with appt, could you please?  Thanks.  Vinnie Level

## 2019-01-20 NOTE — Telephone Encounter (Signed)
Spoke with patient.  Advised per Dr. Sabra Heck. Reports Hx of GERD. Patient does not want to f/u with Dr.Mann, is requesting a referral to [facility]. Order placed. Advised patient our referral coordinator will f/u with appt details once scheduled. Patient verbalizes understanding.   Routing to provider for final review. Patient is agreeable to disposition. Will close encounter.  Cc: Magdalene Patricia

## 2019-02-15 ENCOUNTER — Other Ambulatory Visit: Payer: Self-pay | Admitting: Physician Assistant

## 2019-03-11 ENCOUNTER — Other Ambulatory Visit (INDEPENDENT_AMBULATORY_CARE_PROVIDER_SITE_OTHER): Payer: No Typology Code available for payment source

## 2019-03-11 ENCOUNTER — Ambulatory Visit (INDEPENDENT_AMBULATORY_CARE_PROVIDER_SITE_OTHER): Payer: No Typology Code available for payment source | Admitting: Internal Medicine

## 2019-03-11 ENCOUNTER — Encounter: Payer: Self-pay | Admitting: Internal Medicine

## 2019-03-11 VITALS — BP 160/90 | HR 84 | Temp 98.3°F | Ht 65.0 in | Wt 166.5 lb

## 2019-03-11 DIAGNOSIS — Z860101 Personal history of adenomatous and serrated colon polyps: Secondary | ICD-10-CM

## 2019-03-11 DIAGNOSIS — R635 Abnormal weight gain: Secondary | ICD-10-CM

## 2019-03-11 DIAGNOSIS — R140 Abdominal distension (gaseous): Secondary | ICD-10-CM

## 2019-03-11 DIAGNOSIS — Z86010 Personal history of colonic polyps: Secondary | ICD-10-CM | POA: Diagnosis not present

## 2019-03-11 DIAGNOSIS — K219 Gastro-esophageal reflux disease without esophagitis: Secondary | ICD-10-CM | POA: Diagnosis not present

## 2019-03-11 DIAGNOSIS — Z01818 Encounter for other preprocedural examination: Secondary | ICD-10-CM

## 2019-03-11 LAB — IGA: IgA: 159 mg/dL (ref 68–378)

## 2019-03-11 MED ORDER — FAMOTIDINE 20 MG PO TABS: 20.0000 mg | ORAL_TABLET | Freq: Every day | ORAL | 3 refills | Status: DC

## 2019-03-11 NOTE — Patient Instructions (Signed)
You have been scheduled for an endoscopy and colonoscopy. Please follow the written instructions given to you at your visit today. Please pick up your prep supplies at the pharmacy within the next 1-3 days. If you use inhalers (even only as needed), please bring them with you on the day of your procedure.   Your provider has requested that you go to the basement level for lab work before leaving today. Press "B" on the elevator. The lab is located at the first door on the left as you exit the elevator.   We have sent the following medications to your pharmacy for you to pick up at your convenience:  Generic Pepcid  We are giving you a GERD handout to read over today, esp notice the elevate your bed.  I appreciate the opportunity to care for you. Silvano Rusk, MD, [facility]

## 2019-03-11 NOTE — Progress Notes (Addendum)
Tara Hendrix 63 y.o. 1955/07/06 [id number] Referred by: Brunetta Jeans, PA-C Edwinna Areola, MD Assessment & Plan:   Encounter Diagnoses  Name Primary?  . Gastroesophageal reflux disease, unspecified whether esophagitis present Yes  . Hx of adenomatous colonic polyps   . Bloating   . Weight gain   . Encounter for other preprocedural examination    Certainly seems like she has GERD, will add nighttime PPI work on lifestyle changes.  Her weight gain undoubtably has something to do with this.  I have given her some literature on healthy oils to use, she is a "foody" and cooks with lots of different oils and it would be my recommendation that she avoid vegetable oils.  Further counseling on healthy eating and weight loss later.  She may need to raise the head of her bed.  Other lifestyle measures and handout that I gave her as well.  Work on reducing caffeine.  Also reviewed use of Ambien as that has been linked to reflux.  Not discussed today.  Evaluate this abnormal duodenal biopsy with intraepithelial lymphocytosis with a TTG antibody and IgA level.  EGD and colonoscopy as well for work-up of these problems.  The colonoscopy is mainly because of follow-up of adenomatous colonic polyps.The risks and benefits as well as alternatives of endoscopic procedure(s) have been discussed and reviewed. All questions answered. The patient agrees to proceed.   Orders Placed This Encounter  Procedures  . SARS Coronavirus 2 ([facility] ONLY)  . Tissue transglutaminase, IgA  . IgA  . Ambulatory referral to Gastroenterology   Meds ordered this encounter  Medications  . famotidine (PEPCID) 20 MG tablet    Sig: Take 1 tablet (20 mg total) by mouth at bedtime.    Dispense:  90 tablet    Refill:  3   I appreciate the opportunity to care for this patient. CC: Brunetta Jeans, PA-C Dr. Edwinna Areola Subjective:   Chief Complaint: Reflux bloating  HPI The patient is a  64 year old woman with a longstanding history of heartburn and reflux.  She has been on Dexilant for years but had an insurance change so is now on AcipHex 20 mg daily and generally does okay but has been having several nights a week where she will wake up in the middle the night with regurgitation or heartburn.  She does not have dysphagia.  She does give 3 hours between eating and lying down.  Her weight is up 10 pounds in the past year.  With the pandemic she is not traveling she has been cooking a lot and says she is a "foodie".  We did not talk much about cooking her food, she thinks she eats healthy but she does use all types of cooking oils  including vegetable oils.  Previous evaluation by Dr. Collene Mares in 2013.  EGD with a small hiatal hernia, fundic gland polyps and duodenal biopsies taken as part of an iron deficiency anemia work-up small bowel intraepithelial lymphocytosis which could be celiac disease.  She says she was never told about this or had it explained.  She had a diminutive tubular adenoma on that colonoscopy and says she has a previous history of colon polyps as well.  Wt Readings from Last 3 Encounters:  03/11/19 166 lb 8 oz (75.5 kg)  05/01/18 156 lb (70.8 kg)  03/29/18 156 lb 9.6 oz (71 kg)    No Known Allergies Current Meds  Medication Sig  . Cholecalciferol (VITAMIN D3) 50 MCG (2000  UT) TABS Take 1 tablet by mouth daily.  . RABEprazole (ACIPHEX) 20 MG tablet TAKE ONE TABLET BY MOUTH ONE TIME DAILY   . zolpidem (AMBIEN) 5 MG tablet Take 1 tablet (5 mg total) by mouth at bedtime as needed for sleep.   Past Medical History:  Diagnosis Date  . Allergy   . Anemia, iron deficiency   . Celiac sprue    found with EGD biopsy done 09/13/2011  . Constipation   . Gastric polyps   . GERD (gastroesophageal reflux disease)   . Hiatal hernia   . History of chickenpox   . Insomnia   . Internal hemorrhoids   . Kidney stone   . Seasonal allergies   . Tubular adenoma of colon  09/13/2011   removed from the decending colon   Past Surgical History:  Procedure Laterality Date  . BUNIONECTOMY Right 12/15/2011  . COLONOSCOPY W/ BIOPSIES  09/13/2011  . ESOPHAGOGASTRODUODENOSCOPY  09/13/2011  . 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   3 alcoholic beverages a day   3 caffeinated beverages a day   No drug or tobacco use former smoker   family history includes Cancer in her paternal grandmother; Drug abuse in her brother; Hearing loss in her father; Heart attack in her mother; Hypertension in her mother; Intellectual disability in her paternal aunt; Lung cancer in her mother; Stroke in her paternal grandfather.   Review of Systems As per HPI, also positive for some allergy and sinus problems and insomnia All other review of systems negative Objective:   Physical Exam @BP  (!) 160/90 (BP Location: Left Arm, Patient Position: Sitting, Cuff Size: Normal)   Pulse 84   Temp 98.3 F (36.8 C)   Ht 5\' 5"  (1.651 m) Comment: height measured without shoes  Wt 166 lb 8 oz (75.5 kg)   LMP 01/11/2014 (Exact Date)   BMI 27.71 kg/m @  General:  Well-developed, well-nourished and in no acute distress Eyes:  anicteric. Lungs: Clear to auscultation bilaterally. Heart:  S1S2, no rubs, murmurs, gallops. Abdomen:  soft, non-tender, no hepatosplenomegaly, hernia, or mass and BS+.  Rectal: deferred  Skin   no rash. Neuro:  A&O x 3.  Psych:  appropriate mood and  Affect.   Data Reviewed: See HPI

## 2019-03-12 ENCOUNTER — Other Ambulatory Visit: Payer: Self-pay | Admitting: Obstetrics & Gynecology

## 2019-03-12 DIAGNOSIS — R921 Mammographic calcification found on diagnostic imaging of breast: Secondary | ICD-10-CM

## 2019-03-12 LAB — TISSUE TRANSGLUTAMINASE, IGA: (tTG) Ab, IgA: 1 U/mL

## 2019-03-28 ENCOUNTER — Other Ambulatory Visit: Payer: Self-pay | Admitting: Internal Medicine

## 2019-03-31 ENCOUNTER — Other Ambulatory Visit: Payer: Self-pay | Admitting: Internal Medicine

## 2019-03-31 ENCOUNTER — Ambulatory Visit (INDEPENDENT_AMBULATORY_CARE_PROVIDER_SITE_OTHER): Payer: Self-pay

## 2019-03-31 DIAGNOSIS — K317 Polyp of stomach and duodenum: Secondary | ICD-10-CM

## 2019-03-31 DIAGNOSIS — Z1159 Encounter for screening for other viral diseases: Secondary | ICD-10-CM

## 2019-03-31 HISTORY — DX: Polyp of stomach and duodenum: K31.7

## 2019-03-31 LAB — SARS CORONAVIRUS 2 (TAT 6-24 HRS): SARS Coronavirus 2: NEGATIVE

## 2019-04-02 ENCOUNTER — Ambulatory Visit (AMBULATORY_SURGERY_CENTER): Payer: No Typology Code available for payment source | Admitting: Internal Medicine

## 2019-04-02 ENCOUNTER — Encounter: Payer: Self-pay | Admitting: Internal Medicine

## 2019-04-02 ENCOUNTER — Other Ambulatory Visit: Payer: Self-pay

## 2019-04-02 VITALS — BP 140/80 | HR 67 | Temp 95.5°F | Resp 9 | Ht 65.0 in | Wt 166.0 lb

## 2019-04-02 DIAGNOSIS — D125 Benign neoplasm of sigmoid colon: Secondary | ICD-10-CM

## 2019-04-02 DIAGNOSIS — K317 Polyp of stomach and duodenum: Secondary | ICD-10-CM

## 2019-04-02 DIAGNOSIS — K219 Gastro-esophageal reflux disease without esophagitis: Secondary | ICD-10-CM

## 2019-04-02 DIAGNOSIS — D123 Benign neoplasm of transverse colon: Secondary | ICD-10-CM | POA: Diagnosis not present

## 2019-04-02 DIAGNOSIS — D124 Benign neoplasm of descending colon: Secondary | ICD-10-CM

## 2019-04-02 DIAGNOSIS — Z86010 Personal history of colonic polyps: Secondary | ICD-10-CM | POA: Diagnosis not present

## 2019-04-02 DIAGNOSIS — K449 Diaphragmatic hernia without obstruction or gangrene: Secondary | ICD-10-CM | POA: Diagnosis not present

## 2019-04-02 MED ORDER — SODIUM CHLORIDE 0.9 % IV SOLN: 500.0000 mL | Freq: Once | INTRAVENOUS | Status: DC

## 2019-04-02 NOTE — Op Note (Addendum)
[facility] Patient Name: Tara Hendrix Procedure Date: 04/02/2019 9:24 AM MRN: [id number] Endoscopist: Gatha Mayer , MD Age: 64 Referring MD:  Date of Birth: [birth year] Gender: Female Account #: [id number] Procedure:                Colonoscopy Indications:              Last colonoscopy: 2013 Medicines:                Propofol per Anesthesia, Monitored Anesthesia Care Procedure:                Pre-Anesthesia Assessment:                           - Prior to the procedure, a History and Physical                            was performed, and patient medications and                            allergies were reviewed. The patient's tolerance of                            previous anesthesia was also reviewed. The risks                            and benefits of the procedure and the sedation                            options and risks were discussed with the patient.                            All questions were answered, and informed consent                            was obtained. Prior Anticoagulants: The patient has                            taken no previous anticoagulant or antiplatelet                            agents. ASA Grade Assessment: II - A patient with                            mild systemic disease. After reviewing the risks                            and benefits, the patient was deemed in                            satisfactory condition to undergo the procedure.                           After obtaining informed consent, the colonoscope  was passed under direct vision. Throughout the                            procedure, the patient's blood pressure, pulse, and                            oxygen saturations were monitored continuously. The                            Colonoscope was introduced through the anus and                            advanced to the the cecum, identified by                            appendiceal orifice and  ileocecal valve. The                            colonoscopy was performed without difficulty. The                            patient tolerated the procedure well. The quality                            of the bowel preparation was excellent. The                            ileocecal valve, appendiceal orifice, and rectum                            were photographed. Scope In: 9:38:32 AM Scope Out: 9:58:11 AM Scope Withdrawal Time: 0 hours 15 minutes 27 seconds  Total Procedure Duration: 0 hours 19 minutes 39 seconds  Findings:                 The perianal and digital rectal examinations were                            normal.                           Three sessile and semi-pedunculated polyps were                            found in the sigmoid colon, descending colon and                            transverse colon. The polyps were 2 to 5 mm in                            size. These polyps were removed with a cold snare.                            Resection and retrieval were complete. Verification  of patient identification for the specimen was                            done. Estimated blood loss was minimal.                           Multiple diverticula were found in the sigmoid                            colon.                           The exam was otherwise without abnormality on                            direct and retroflexion views. Complications:            No immediate complications. Estimated Blood Loss:     Estimated blood loss was minimal. Impression:               - Three 2 to 5 mm polyps in the sigmoid colon, in                            the descending colon and in the transverse colon,                            removed with a cold snare. Resected and retrieved.                           - Diverticulosis in the sigmoid colon.                           - The examination was otherwise normal on direct                            and  retroflexion views. Recommendation:           - Patient has a contact number available for                            emergencies. The signs and symptoms of potential                            delayed complications were discussed with the                            patient. Return to normal activities tomorrow.                            Written discharge instructions were provided to the                            patient.                           - Resume previous diet.                           -  Continue present medications.                           - See the other procedure note for documentation of                            additional recommendations.                           - Repeat colonoscopy date to be determined after                            pending pathology results are reviewed for                            surveillance. Gatha Mayer, MD 04/02/2019 10:14:34 AM This report has been signed electronically. Addendum Number: 1   Addendum Date: 04/24/2019 11:40:05 AM      INDICATION FOR COLONOSCOPY      [facility] Gatha Mayer, MD 04/24/2019 11:40:29 AM This report has been signed electronically.

## 2019-04-02 NOTE — Op Note (Addendum)
[facility] Patient Name: Tara Hendrix Procedure Date: 04/02/2019 9:24 AM MRN: [id number] Endoscopist: Gatha Mayer , MD Age: 64 Referring MD:  Date of Birth: 1955/11/11 Gender: Female Account #: [id number] Procedure:                Upper GI endoscopy Indications:              Esophageal reflux, Esophageal reflux symptoms that                            persist despite appropriate therapy Medicines:                Propofol per Anesthesia, Monitored Anesthesia Care Procedure:                Pre-Anesthesia Assessment:                           - Prior to the procedure, a History and Physical                            was performed, and patient medications and                            allergies were reviewed. The patient's tolerance of                            previous anesthesia was also reviewed. The risks                            and benefits of the procedure and the sedation                            options and risks were discussed with the patient.                            All questions were answered, and informed consent                            was obtained. Prior Anticoagulants: The patient has                            taken no previous anticoagulant or antiplatelet                            agents. ASA Grade Assessment: II - A patient with                            mild systemic disease. After reviewing the risks                            and benefits, the patient was deemed in                            satisfactory condition to undergo the procedure.  After obtaining informed consent, the endoscope was                            passed under direct vision. Throughout the                            procedure, the patient's blood pressure, pulse, and                            oxygen saturations were monitored continuously. The                            Endoscope was introduced through the mouth, and       advanced to the second part of duodenum. The upper                            GI endoscopy was accomplished without difficulty.                            The patient tolerated the procedure well. Scope In: Scope Out: Findings:                 The examined esophagus was normal.                           A 5 cm hiatal hernia was present.                           Two diminutive sessile polyps with no stigmata of                            recent bleeding were found in the cardia. The polyp                            was removed with a cold biopsy forceps. Resection                            and retrieval were complete. Verification of                            patient identification for the specimen was done.                            Estimated blood loss was minimal.                           Striped mildly erythematous mucosa without bleeding                            was found in the prepyloric region of the stomach.                           The exam was otherwise without abnormality.  The cardia and gastric fundus were normal on                            retroflexion.                           Biopsies for histology were taken with a cold                            forceps in the entire duodenum for evaluation of                            celiac disease. Verification of patient                            identification for the specimen was done. Estimated                            blood loss was minimal. Complications:            No immediate complications. Estimated Blood Loss:     Estimated blood loss was minimal. Impression:               - Normal esophagus.                           - 5 cm hiatal hernia.                           - Two gastric polyps. Resected and retrieved.                           - Erythematous mucosa in the prepyloric region of                            the stomach.                           - The examination was  otherwise normal.                           - Biopsies were taken with a cold forceps for                            evaluation of celiac disease. Recommendation:           - Patient has a contact number available for                            emergencies. The signs and symptoms of potential                            delayed complications were discussed with the                            patient. Return to normal activities tomorrow.  Written discharge instructions were provided to the                            patient.                           - Resume previous diet.                           - Continue present medications. Note Lorrin Mais is used                            only teice a month so do not think a problem                            [id number]                           - See the other procedure note for documentation of                            additional recommendations.                           - Await pathology results. We did discuss low carb                            and intermittent fasting for weight loss -                            recommended [email or web address] Gatha Mayer, MD 04/02/2019 10:11:05 AM This report has been signed electronically.

## 2019-04-02 NOTE — Progress Notes (Signed)
VS- Tara Hendrix

## 2019-04-02 NOTE — Patient Instructions (Addendum)
EGD Main findings are: Hiatal hernia 2 tiny polyps removed Otherwise nothing significantly abnormal. I did recheck the duodenum with biopsies   COLON Main findings:  3 small polyps look benign - removed Some diverticulosis  I will let you know pathology results and when to have follow-up and another routine colonoscopy by phone call or My Chart.  I appreciate the opportunity to care for you. Gatha Mayer, MD, FACG  YOU HAD AN ENDOSCOPIC PROCEDURE TODAY AT [facility]:   Refer to the procedure report that was given to you for any specific questions about what was found during the examination.  If the procedure report does not answer your questions, please call your gastroenterologist to clarify.  If you requested that your care partner not be given the details of your procedure findings, then the procedure report has been included in a sealed envelope for you to review at your convenience later.  YOU SHOULD EXPECT: Some feelings of bloating in the abdomen. Passage of more gas than usual.  Walking can help get rid of the air that was put into your GI tract during the procedure and reduce the bloating. If you had a lower endoscopy (such as a colonoscopy or flexible sigmoidoscopy) you may notice spotting of blood in your stool or on the toilet paper. If you underwent a bowel prep for your procedure, you may not have a normal bowel movement for a few days.  Please Note:  You might notice some irritation and congestion in your nose or some drainage.  This is from the oxygen used during your procedure.  There is no need for concern and it should clear up in a day or so.  SYMPTOMS TO REPORT IMMEDIATELY:   Following lower endoscopy (colonoscopy or flexible sigmoidoscopy):  Excessive amounts of blood in the stool  Significant tenderness or worsening of abdominal pains  Swelling of the abdomen that is new, acute  Fever of 100F or higher   Following upper endoscopy  (EGD)  Vomiting of blood or coffee ground material  New chest pain or pain under the shoulder blades  Painful or persistently difficult swallowing  New shortness of breath  Fever of 100F or higher  Black, tarry-looking stools  For urgent or emergent issues, a gastroenterologist can be reached at any hour by calling [phone].   DIET:  We do recommend a small meal at first, but then you may proceed to your regular diet.  Drink plenty of fluids but you should avoid alcoholic beverages for 24 hours.  ACTIVITY:  You should plan to take it easy for the rest of today and you should NOT DRIVE or use heavy machinery until tomorrow (because of the sedation medicines used during the test).    FOLLOW UP: Our staff will call the number listed on your records 48-72 hours following your procedure to check on you and address any questions or concerns that you may have regarding the information given to you following your procedure. If we do not reach you, we will leave a message.  We will attempt to reach you two times.  During this call, we will ask if you have developed any symptoms of COVID 19. If you develop any symptoms (ie: fever, flu-like symptoms, shortness of breath, cough etc.) before then, please call [phone].  If you test positive for Covid 19 in the 2 weeks post procedure, please call and report this information to [place].    If any biopsies were taken you  will be contacted by phone or by letter within the next 1-3 weeks.  Please call us at [phone] if you have not heard about the biopsies in 3 weeks.    SIGNATURES/CONFIDENTIALITY: You and/or your care partner have signed paperwork which will be entered into your electronic medical record.  These signatures attest to the fact that that the information above on your After Visit Summary has been reviewed and is understood.  Full responsibility of the confidentiality of this discharge information lies with you and/or your  care-partner.

## 2019-04-02 NOTE — Progress Notes (Signed)
To PACU, VSS. Report to [email or web address] 

## 2019-04-02 NOTE — Progress Notes (Signed)
Called to room to assist during endoscopic procedure.  Patient ID and intended procedure confirmed with present staff. Received instructions for my participation in the procedure from the performing physician.  

## 2019-04-04 ENCOUNTER — Telehealth: Payer: Self-pay

## 2019-04-04 NOTE — Telephone Encounter (Signed)
  Follow up Call-  Call back number 04/02/2019  Post procedure Call Back phone  # [phone]  Permission to leave phone message Yes  Some recent data might be hidden     Patient questions:  Do you have a fever, pain , or abdominal swelling? No. Pain Score  0 *  Have you tolerated food without any problems? Yes.    Have you been able to return to your normal activities? Yes.    Do you have any questions about your discharge instructions: Diet   No. Medications  No. Follow up visit  No.  Do you have questions or concerns about your Care? No.  Actions: * If pain score is 4 or above: No action needed, pain <4.  1. Have you developed a fever since your procedure? no  2.   Have you had an respiratory symptoms (SOB or cough) since your procedure? no  3.   Have you tested positive for COVID 19 since your procedure no  4.   Have you had any family members/close contacts diagnosed with the COVID 19 since your procedure?  no   If yes to any of these questions please route to Joylene John, RN and Alphonsa Gin, Therapist, sports.

## 2019-04-07 ENCOUNTER — Encounter: Payer: Self-pay | Admitting: Internal Medicine

## 2019-04-07 ENCOUNTER — Ambulatory Visit
Admission: RE | Admit: 2019-04-07 | Discharge: 2019-04-07 | Disposition: A | Discharge status: Home / Self Care | Payer: No Typology Code available for payment source | Source: Ambulatory Visit | Attending: Obstetrics & Gynecology | Admitting: Obstetrics & Gynecology

## 2019-04-07 ENCOUNTER — Other Ambulatory Visit: Payer: Self-pay | Admitting: Obstetrics & Gynecology

## 2019-04-07 ENCOUNTER — Other Ambulatory Visit: Payer: Self-pay

## 2019-04-07 DIAGNOSIS — R921 Mammographic calcification found on diagnostic imaging of breast: Secondary | ICD-10-CM

## 2019-04-07 DIAGNOSIS — Z86010 Personal history of colonic polyps: Secondary | ICD-10-CM | POA: Insufficient documentation

## 2019-04-07 DIAGNOSIS — Z860101 Personal history of adenomatous and serrated colon polyps: Secondary | ICD-10-CM

## 2019-04-07 HISTORY — DX: Personal history of adenomatous and serrated colon polyps: Z86.0101

## 2019-04-07 HISTORY — DX: Personal history of colonic polyps: Z86.010

## 2019-08-05 NOTE — Progress Notes (Signed)
64 y.o. G1P0010 Married White or Caucasian female here for annual exam.  Doing well.  Did get her Covid vaccination.  Denies vaignal bleeding.    Patient's last menstrual period was 01/11/2014 (exact date).          Sexually active: Yes.    The current method of family planning is post menopausal status & husband vasectomy.    Exercising: Yes.    walking Smoker:  no  Health Maintenance: Pap:  09-14-14 neg HPV HR neg,03-29-2018 neg HPV HR neg History of abnormal Pap:  no MMG:  04-07-2019 bilateral & rt breast u/s category c density birads 2:neg Colonoscopy:  04-02-2019 f/u 48yrs BMD:   02-01-16 normal, care everywhere TDaP:  2012 Pneumonia vaccine(s):  2017 Shingrix:  zostavax 2013 Hep C testing: neg 2017 Screening Labs: obtained today   reports that she quit smoking about a year ago. Her smoking use included cigarettes. She has a 20.00 pack-year smoking history. She has never used smokeless tobacco. She reports current alcohol use. She reports that she does not use drugs.  Past Medical History:  Diagnosis Date  . Allergy   . Anemia, iron deficiency   . Constipation   . Gastric polyps 03/2019   hyperplastic  . GERD (gastroesophageal reflux disease)   . Hiatal hernia   . History of chickenpox   . Hx of adenomatous colonic polyps 04/07/2019  . Insomnia   . Internal hemorrhoids   . Kidney stone   . Seasonal allergies   . Tubular adenoma of colon 09/13/2011   removed from the decending colon    Past Surgical History:  Procedure Laterality Date  . BREAST BIOPSY Left 2020  . BUNIONECTOMY Right 12/15/2011  . COLONOSCOPY    . COLONOSCOPY W/ BIOPSIES  09/13/2011  . ESOPHAGOGASTRODUODENOSCOPY  09/13/2011  . TONSILLECTOMY  1964  . UPPER GASTROINTESTINAL ENDOSCOPY      Current Outpatient Medications  Medication Sig Dispense Refill  . Cholecalciferol (VITAMIN D3) 50 MCG (2000 UT) TABS Take 1 tablet by mouth daily.    . famotidine (PEPCID) 20 MG tablet Take 1 tablet (20 mg total) by  mouth at bedtime. 90 tablet 3  . fluticasone (FLONASE) 50 MCG/ACT nasal spray Place into both nostrils daily.    . RABEprazole (ACIPHEX) 20 MG tablet TAKE ONE TABLET BY MOUTH ONE TIME DAILY  30 tablet 11  . TURMERIC PO Take 1,000 mg by mouth.    . zolpidem (AMBIEN) 5 MG tablet Take 1 tablet (5 mg total) by mouth at bedtime as needed for sleep. 30 tablet 2   No current facility-administered medications for this visit.    Family History  Problem Relation Age of Onset  . Heart attack Mother   . Hypertension Mother   . Lung cancer Mother   . Hearing loss Father   . Drug abuse Brother   . Cancer Paternal Grandmother        unsure type  . Stroke Paternal Grandfather   . Intellectual disability Paternal Aunt   . Breast cancer Neg Hx   . Colon cancer Neg Hx   . Esophageal cancer Neg Hx   . Rectal cancer Neg Hx   . Stomach cancer Neg Hx     Review of Systems  Constitutional: Negative.   HENT: Negative.   Eyes: Negative.   Respiratory: Negative.   Cardiovascular: Negative.   Gastrointestinal: Negative.   Endocrine: Negative.   Genitourinary: Negative.   Musculoskeletal: Negative.   Skin: Negative.   Allergic/Immunologic: Negative.  Neurological: Negative.   Hematological: Negative.   Psychiatric/Behavioral: Negative.     Exam:   BP 120/78   Pulse 70   Temp (!) 97.5 F (36.4 C) (Skin)   Resp 16   Ht 5' 5.25" (1.657 m)   Wt 163 lb (73.9 kg)   LMP 01/11/2014 (Exact Date)   BMI 26.92 kg/m   Height: 5' 5.25" (165.7 cm)  General appearance: alert, cooperative and appears stated age Head: Normocephalic, without obvious abnormality, atraumatic Neck: no adenopathy, supple, symmetrical, trachea midline and thyroid normal to inspection and palpation Lungs: clear to auscultation bilaterally Breasts: normal appearance, no masses or tenderness Heart: regular rate and rhythm Abdomen: soft, non-tender; bowel sounds normal; no masses,  no organomegaly Extremities: extremities  normal, atraumatic, no cyanosis or edema Skin: Skin color, texture, turgor normal. No rashes or lesions Lymph nodes: Cervical, supraclavicular, and axillary nodes normal. No abnormal inguinal nodes palpated Neurologic: Grossly normal   Pelvic: External genitalia:  no lesions              Urethra:  normal appearing urethra with no masses, tenderness or lesions              Bartholins and Skenes: normal                 Vagina: normal appearing vagina with normal color and discharge, no lesions              Cervix: no lesions              Pap taken: No. Bimanual Exam:  Uterus:  normal size, contour, position, consistency, mobility, non-tender              Adnexa: normal adnexa and no mass, fullness, tenderness               Rectovaginal: Confirms               Anus:  normal sphincter tone, no lesions  Chaperone, Terence Lux, CMA, was present for exam.  A:  Well Woman with normal exam PMP, no HRT Insomnia Urethra cyst, stable  P:   Mammogram guidelines reviewed pap smear with neg HR HPV 2020.  Not obtained today. Vit D, B12, HbA1c and lipids obtained today Plan BMD next year.  Order placed. Vaccines reviewed Colonoscopy done 03/2019 Return annually or prn

## 2019-08-06 ENCOUNTER — Other Ambulatory Visit: Payer: Self-pay

## 2019-08-08 ENCOUNTER — Other Ambulatory Visit: Payer: Self-pay

## 2019-08-08 ENCOUNTER — Encounter: Payer: Self-pay | Admitting: Obstetrics & Gynecology

## 2019-08-08 ENCOUNTER — Ambulatory Visit (INDEPENDENT_AMBULATORY_CARE_PROVIDER_SITE_OTHER): Payer: PRIVATE HEALTH INSURANCE | Admitting: Obstetrics & Gynecology

## 2019-08-08 VITALS — BP 120/78 | HR 70 | Temp 97.5°F | Resp 16 | Ht 65.25 in | Wt 163.0 lb

## 2019-08-08 DIAGNOSIS — Z01419 Encounter for gynecological examination (general) (routine) without abnormal findings: Secondary | ICD-10-CM

## 2019-08-08 DIAGNOSIS — Z0000 Encounter for general adult medical examination without abnormal findings: Secondary | ICD-10-CM | POA: Diagnosis not present

## 2019-08-08 DIAGNOSIS — E2839 Other primary ovarian failure: Secondary | ICD-10-CM | POA: Diagnosis not present

## 2019-08-08 MED ORDER — ZOLPIDEM TARTRATE 5 MG PO TABS: 5.0000 mg | ORAL_TABLET | Freq: Every evening | ORAL | 2 refills | Status: DC | PRN

## 2019-08-09 LAB — LIPID PANEL
Chol/HDL Ratio: 3.7 ratio (ref 0.0–4.4)
Cholesterol, Total: 296 mg/dL — ABNORMAL HIGH (ref 100–199)
HDL: 79 mg/dL (ref >39)
LDL Chol Calc (NIH): 195 mg/dL — ABNORMAL HIGH (ref 0–99)
Triglycerides: 124 mg/dL (ref 0–149)
VLDL Cholesterol Cal: 22 mg/dL (ref 5–40)

## 2019-08-09 LAB — VITAMIN B12: Vitamin B-12: 201 pg/mL — ABNORMAL LOW (ref 232–1245)

## 2019-08-09 LAB — HEMOGLOBIN A1C
Est. average glucose Bld gHb Est-mCnc: 123 mg/dL
Hgb A1c MFr Bld: 5.9 % — ABNORMAL HIGH (ref 4.8–5.6)

## 2019-08-09 LAB — VITAMIN D 25 HYDROXY (VIT D DEFICIENCY, FRACTURES): Vit D, 25-Hydroxy: 41.5 ng/mL (ref 30.0–100.0)

## 2019-08-12 ENCOUNTER — Telehealth: Payer: Self-pay

## 2019-08-12 NOTE — Telephone Encounter (Signed)
Patient notified of results. See lab 

## 2019-08-12 NOTE — Telephone Encounter (Signed)
Left message for call back.

## 2019-08-12 NOTE — Telephone Encounter (Signed)
-----   Message from Megan Salon, MD sent at 08/12/2019  6:12 AM EDT ----- Please let pt know her HbA1C was 5.9 and in the prediabetes range.  Her Vit D was in the normal range.  She should keep taking the 2000 IU OTC Vit D she's taking.  Her cholesterol was 296 and LDL was 195.  I think she needs to follow up with her PCP regarding this.  Her B12 was low.  I do not write b12 injections so this is another reason to follow up with her PCP.  Does she want [place] to send a copy of the lab work to him?  Thanks.

## 2019-11-06 ENCOUNTER — Encounter (INDEPENDENT_AMBULATORY_CARE_PROVIDER_SITE_OTHER): Payer: Self-pay

## 2019-12-08 ENCOUNTER — Telehealth: Payer: Self-pay

## 2019-12-08 NOTE — Telephone Encounter (Signed)
Patient has a painful swollen gland in pubic area.

## 2019-12-08 NOTE — Telephone Encounter (Signed)
Spoke with patient. Reports painful lump on labia, increasing in size, approximately 3/4"x1/2" symptoms started 1 wk ago. Denies drainage, fever/chills. Requesting OV with covering provider. OV scheduled for 10/12 at 2:30pm with Dr. Talbert Tara Hendrix. Patient will try warm bath/compresses. Patient is agreeable to date and time.   Encounter closed.

## 2019-12-08 NOTE — Telephone Encounter (Signed)
Left message to call Sharee Pimple, RN at [facility].   Last AEX w/ Dr. Sabra Heck 08/08/19

## 2019-12-09 ENCOUNTER — Ambulatory Visit (INDEPENDENT_AMBULATORY_CARE_PROVIDER_SITE_OTHER): Payer: No Typology Code available for payment source | Admitting: Obstetrics and Gynecology

## 2019-12-09 ENCOUNTER — Other Ambulatory Visit: Payer: Self-pay

## 2019-12-09 ENCOUNTER — Encounter: Payer: Self-pay | Admitting: Obstetrics and Gynecology

## 2019-12-09 VITALS — BP 132/60 | HR 98 | Ht 66.0 in | Wt 161.0 lb

## 2019-12-09 DIAGNOSIS — N764 Abscess of vulva: Secondary | ICD-10-CM

## 2019-12-09 NOTE — Progress Notes (Signed)
GYNECOLOGY  VISIT   HPI: 63 y.o.   Married White or Caucasian Not Hispanic or Latino  female   G1P0010 with Patient's last menstrual period was 01/11/2014 (exact date).   here for lump on her labia was painful this weekend.  She first noted a lump on the left vulva 10 days ago, got larger and tender over the weekend. Now shrinking 1/3 smaller, less tender. No drainage.   GYNECOLOGIC HISTORY: Patient's last menstrual period was 01/11/2014 (exact date). Contraception:PMP Menopausal hormone therapy: none         OB History    Gravida  1   Para  0   Term  0   Preterm  0   AB  1   Living  0     SAB  0   TAB  1   Ectopic  0   Multiple  0   Live Births  0              Patient Active Problem List   Diagnosis Date Noted  . Hx of adenomatous colonic polyps 04/07/2019  . Chronic GERD 05/01/2018  . Tobacco use disorder 05/01/2018    Past Medical History:  Diagnosis Date  . Allergy   . Anemia, iron deficiency   . Constipation   . Gastric polyps 03/2019   hyperplastic  . GERD (gastroesophageal reflux disease)   . Hiatal hernia   . History of chickenpox   . Hx of adenomatous colonic polyps 04/07/2019  . Insomnia   . Internal hemorrhoids   . Kidney stone   . Seasonal allergies   . Tubular adenoma of colon 09/13/2011   removed from the decending colon    Past Surgical History:  Procedure Laterality Date  . BREAST BIOPSY Left 2020  . BUNIONECTOMY Right 12/15/2011  . COLONOSCOPY    . COLONOSCOPY W/ BIOPSIES  09/13/2011  . ESOPHAGOGASTRODUODENOSCOPY  09/13/2011  . TONSILLECTOMY  1964  . UPPER GASTROINTESTINAL ENDOSCOPY      Current Outpatient Medications  Medication Sig Dispense Refill  . Cholecalciferol (VITAMIN D3) 50 MCG (2000 UT) TABS Take 1 tablet by mouth daily.    . famotidine (PEPCID) 20 MG tablet Take 1 tablet (20 mg total) by mouth at bedtime. 90 tablet 3  . fluticasone (FLONASE) 50 MCG/ACT nasal spray Place into both nostrils daily.    .  RABEprazole (ACIPHEX) 20 MG tablet TAKE ONE TABLET BY MOUTH ONE TIME DAILY  30 tablet 11  . TURMERIC PO Take 1,000 mg by mouth.    . zolpidem (AMBIEN) 5 MG tablet Take 1 tablet (5 mg total) by mouth at bedtime as needed for sleep. 30 tablet 2   No current facility-administered medications for this visit.     ALLERGIES: Patient has no known allergies.  Family History  Problem Relation Age of Onset  . Heart attack Mother   . Hypertension Mother   . Lung cancer Mother   . Hearing loss Father   . Drug abuse Brother   . Cancer Paternal Grandmother        unsure type  . Stroke Paternal Grandfather   . Intellectual disability Paternal Aunt   . Breast cancer Neg Hx   . Colon cancer Neg Hx   . Esophageal cancer Neg Hx   . Rectal cancer Neg Hx   . Stomach cancer Neg Hx     Social History   Socioeconomic History  . Marital status: Married    Spouse name: Not on file  .  Number of children: 0  . Years of education: Not on file  . Highest education level: Not on file  Occupational History  . Occupation: Sales  Tobacco Use  . Smoking status: Former Smoker    Packs/day: 1.00    Years: 20.00    Pack years: 20.00    Types: Cigarettes    Quit date: 08/28/2018    Years since quitting: 1.2  . Smokeless tobacco: Never Used  Vaping Use  . Vaping Use: Former  Substance and Sexual Activity  . Alcohol use: Yes    Comment: 4 per day  . Drug use: No  . Sexual activity: Yes    Partners: Male    Birth control/protection: Surgical, Post-menopausal    Comment: vasectomy  Other Topics Concern  . 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   3 alcoholic beverages a day   3 caffeinated beverages a day   No drug or tobacco use former smoker   Social Determinants of Radio broadcast assistant Strain:   . Difficulty of Paying Living Expenses: Not on file   Food Insecurity:   . Worried About Charity fundraiser in the Last Year: Not on file  . Ran Out of Food in the Last Year: Not on file  Transportation Needs:   . Lack of Transportation (Medical): Not on file  . Lack of Transportation (Non-Medical): Not on file  Physical Activity:   . Days of Exercise per Week: Not on file  . Minutes of Exercise per Session: Not on file  Stress:   . Feeling of Stress : Not on file  Social Connections:   . Frequency of Communication with Friends and Family: Not on file  . Frequency of Social Gatherings with Friends and Family: Not on file  . Attends Religious Services: Not on file  . Active Member of Clubs or Organizations: Not on file  . Attends Archivist Meetings: Not on file  . Marital Status: Not on file  Intimate Partner Violence:   . Fear of Current or Ex-Partner: Not on file  . Emotionally Abused: Not on file  . Physically Abused: Not on file  . Sexually Abused: Not on file    Review of Systems  All other systems reviewed and are negative.   PHYSICAL EXAMINATION:    BP 132/60   Pulse 98   Ht 5\' 6"  (1.676 m)   Wt 161 lb (73 kg)   LMP 01/11/2014 (Exact Date)   SpO2 99%   BMI 25.99 kg/m     General appearance: alert, cooperative and appears stated age  Pelvic: External genitalia:  Lower left labia majora is a resolving ~1 cm abscess, appears to have drained centrally. No surrounding cellulitis.              Urethra:  normal appearing urethra with no masses, tenderness or lesions              Bartholins and Skenes: normal                  Chaperone was present for exam.  ASSESSMENT Vulvar abscess, resolving    PLAN Warm compresses, hot soaks Call if not continuing to resolve   CC: Dr Sabra Heck

## 2019-12-09 NOTE — Patient Instructions (Signed)
Skin Abscess  A skin abscess is an infected area on or under your skin that contains a collection of pus and other material. An abscess may also be called a furuncle, carbuncle, or boil. An abscess can occur in or on almost any part of your body. Some abscesses break open (rupture) on their own. Most continue to get worse unless they are treated. The infection can spread deeper into the body and eventually into your blood, which can make you feel ill. Treatment usually involves draining the abscess. What are the causes? An abscess occurs when germs, like bacteria, pass through your skin and cause an infection. This may be caused by:  A scrape or cut on your skin.  A puncture wound through your skin, including a needle injection or insect bite.  Blocked oil or sweat glands.  Blocked and infected hair follicles.  A cyst that forms beneath your skin (sebaceous cyst) and becomes infected. What increases the risk? This condition is more likely to develop in people who:  Have a weak body defense system (immune system).  Have diabetes.  Have dry and irritated skin.  Get frequent injections or use illegal IV drugs.  Have a foreign body in a wound, such as a splinter.  Have problems with their lymph system or veins. What are the signs or symptoms? Symptoms of this condition include:  A painful, firm bump under the skin.  A bump with pus at the top. This may break through the skin and drain. Other symptoms include:  Redness surrounding the abscess site.  Warmth.  Swelling of the lymph nodes (glands) near the abscess.  Tenderness.  A sore on the skin. How is this diagnosed? This condition may be diagnosed based on:  A physical exam.  Your medical history.  A sample of pus. This may be used to find out what is causing the infection.  Blood tests.  Imaging tests, such as an ultrasound, CT scan, or MRI. How is this treated? A small abscess that drains on its own may  not need treatment. Treatment for larger abscesses may include:  Moist heat or heat pack applied to the area several times a day.  A procedure to drain the abscess (incision and drainage).  Antibiotic medicines. For a severe abscess, you may first get antibiotics through an IV and then change to antibiotics by mouth. Follow these instructions at home: Medicines   Take over-the-counter and prescription medicines only as told by your health care provider.  If you were prescribed an antibiotic medicine, take it as told by your health care provider. Do not stop taking the antibiotic even if you start to feel better. Abscess care   If you have an abscess that has not drained, apply heat to the affected area. Use the heat source that your health care provider recommends, such as a moist heat pack or a heating pad. ? Place a towel between your skin and the heat source. ? Leave the heat on for 20-30 minutes. ? Remove the heat if your skin turns bright red. This is especially important if you are unable to feel pain, heat, or cold. You may have a greater risk of getting burned.  Follow instructions from your health care provider about how to take care of your abscess. Make sure you: ? Cover the abscess with a bandage (dressing). ? Change your dressing or gauze as told by your health care provider. ? Wash your hands with soap and water before you change the   dressing or gauze. If soap and water are not available, use hand sanitizer.  Check your abscess every day for signs of a worsening infection. Check for: ? More redness, swelling, or pain. ? More fluid or blood. ? Warmth. ? More pus or a bad smell. General instructions  To avoid spreading the infection: ? Do not share personal care items, towels, or hot tubs with others. ? Avoid making skin contact with other people.  Keep all follow-up visits as told by your health care provider. This is important. Contact a health care provider if  you have:  More redness, swelling, or pain around your abscess.  More fluid or blood coming from your abscess.  Warm skin around your abscess.  More pus or a bad smell coming from your abscess.  A fever.  Muscle aches.  Chills or a general ill feeling. Get help right away if you:  Have severe pain.  See red streaks on your skin spreading away from the abscess. Summary  A skin abscess is an infected area on or under your skin that contains a collection of pus and other material.  A small abscess that drains on its own may not need treatment.  Treatment for larger abscesses may include having a procedure to drain the abscess and taking an antibiotic. This information is not intended to replace advice given to you by your health care provider. Make sure you discuss any questions you have with your health care provider. Document Revised: 06/06/2018 Document Reviewed: 03/29/2017 Elsevier Patient Education  2020 [employer]  

## 2020-03-05 ENCOUNTER — Other Ambulatory Visit: Payer: Self-pay | Admitting: Obstetrics & Gynecology

## 2020-03-05 DIAGNOSIS — Z1231 Encounter for screening mammogram for malignant neoplasm of breast: Secondary | ICD-10-CM

## 2020-03-22 ENCOUNTER — Telehealth: Payer: Self-pay | Admitting: Physician Assistant

## 2020-03-22 NOTE — Telephone Encounter (Signed)
Patient tested positive for covid with an at home test.  Do you recommend that she go and have a PCR test?  Please advise.

## 2020-04-12 ENCOUNTER — Other Ambulatory Visit: Payer: Self-pay | Admitting: Internal Medicine

## 2020-04-15 ENCOUNTER — Other Ambulatory Visit: Payer: Self-pay

## 2020-04-15 ENCOUNTER — Ambulatory Visit
Admission: RE | Admit: 2020-04-15 | Discharge: 2020-04-15 | Disposition: A | Discharge status: Home / Self Care | Payer: Self-pay | Source: Ambulatory Visit | Attending: Obstetrics & Gynecology | Admitting: Obstetrics & Gynecology

## 2020-04-15 DIAGNOSIS — Z1231 Encounter for screening mammogram for malignant neoplasm of breast: Secondary | ICD-10-CM

## 2020-05-17 ENCOUNTER — Ambulatory Visit (INDEPENDENT_AMBULATORY_CARE_PROVIDER_SITE_OTHER): Payer: PRIVATE HEALTH INSURANCE | Admitting: Dermatology

## 2020-05-17 ENCOUNTER — Ambulatory Visit
Admission: RE | Admit: 2020-05-17 | Discharge: 2020-05-17 | Disposition: A | Discharge status: Home / Self Care | Payer: PRIVATE HEALTH INSURANCE | Source: Ambulatory Visit | Attending: Obstetrics & Gynecology | Admitting: Obstetrics & Gynecology

## 2020-05-17 ENCOUNTER — Encounter: Payer: Self-pay | Admitting: Dermatology

## 2020-05-17 ENCOUNTER — Other Ambulatory Visit: Payer: Self-pay

## 2020-05-17 DIAGNOSIS — L821 Other seborrheic keratosis: Secondary | ICD-10-CM | POA: Diagnosis not present

## 2020-05-17 DIAGNOSIS — L729 Follicular cyst of the skin and subcutaneous tissue, unspecified: Secondary | ICD-10-CM

## 2020-05-17 DIAGNOSIS — D485 Neoplasm of uncertain behavior of skin: Secondary | ICD-10-CM

## 2020-05-17 DIAGNOSIS — D1801 Hemangioma of skin and subcutaneous tissue: Secondary | ICD-10-CM

## 2020-05-17 DIAGNOSIS — L570 Actinic keratosis: Secondary | ICD-10-CM

## 2020-05-17 DIAGNOSIS — Z1283 Encounter for screening for malignant neoplasm of skin: Secondary | ICD-10-CM | POA: Diagnosis not present

## 2020-05-17 DIAGNOSIS — E2839 Other primary ovarian failure: Secondary | ICD-10-CM

## 2020-05-17 NOTE — Patient Instructions (Signed)

## 2020-05-26 ENCOUNTER — Encounter: Payer: Self-pay | Admitting: Dermatology

## 2020-05-26 NOTE — Progress Notes (Signed)
   New Patient   Subjective  Tara Hendrix is a 65 y.o. female who presents for the following: Annual Exam (Full body skin check. Per patient has "horn something" on right arm just above elbow that she wants taken off./Patient wants to make sure that her right thumb nail isn't infected, stuck with shrimp skewer. [place] face regiment. ).  General skin examination, new enlarging growth of her right arm Location:  Duration:  Quality:  Associated Signs/Symptoms: Modifying Factors:  Severity:  Timing: Context:    The following portions of the chart were reviewed this encounter and updated as appropriate:  Tobacco  Allergies  Meds  Problems  Med Hx  Surg Hx  Fam Hx      Objective  Well appearing patient in no apparent distress; mood and affect are within normal limits. Objective  Left Inguinal Area: Full body skin check.  No atypical pigmented lesions.  No evidence of infection of the thumb nail or periungual.  Objective  Right Upper Arm - Anterior: Verrucous 5 mm crust, rule out SCCA       Objective  Right Upper Back: 45mm open epidermal cyst, nonfluctuant  Objective  Left Abdomen (side) - Upper: Four millimeter smooth red papule  Objective  Left Upper Back: multiple flattopped brown 4 to 6 mm textured papules    A full examination was performed including scalp, head, eyes, ears, nose, lips, neck, chest, axillae, abdomen, back, buttocks, bilateral upper extremities, bilateral lower extremities, hands, feet, fingers, toes, fingernails, and toenails. All findings within normal limits unless otherwise noted below.  Areas beneath undergarments not fully examined.   Assessment & Plan  Screening exam for skin cancer Left Inguinal Area  Yearly skin check.  Neoplasm of uncertain behavior of skin Right Upper Arm - Anterior  Skin / nail biopsy Type of biopsy: tangential   Informed consent: discussed and consent obtained   Timeout: patient name, date of  birth, surgical site, and procedure verified   Procedure prep:  Patient was prepped and draped in usual sterile fashion (Non sterile) Prep type:  Chlorhexidine Anesthesia: the lesion was anesthetized in a standard fashion   Anesthetic:  1% lidocaine w/ epinephrine 1-100,000 local infiltration Instrument used: flexible razor blade   Outcome: patient tolerated procedure well   Post-procedure details: wound care instructions given    Destruction of lesion Complexity: simple   Destruction method: electrodesiccation and curettage   Informed consent: discussed and consent obtained   Timeout:  patient name, date of birth, surgical site, and procedure verified Anesthesia: the lesion was anesthetized in a standard fashion   Anesthetic:  1% lidocaine w/ epinephrine 1-100,000 local infiltration Curettage performed in three different directions: Yes   Curettage cycles:  1 Margin per side (cm):  0.1 Final wound size (cm):  0.6 Hemostasis achieved with:  aluminum chloride Outcome: patient tolerated procedure well with no complications   Post-procedure details: wound care instructions given    Specimen 1 - Surgical pathology Differential Diagnosis: bcc vs scc, wart-2 specimens   Check Margins: No  Cyst of skin Right Upper Back  Ok to leave if stable.  Will schedule 30 minutes if desires removal  Cherry angioma Left Abdomen (side) - Upper  Leave if stable  Seborrheic keratosis Left Upper Back  Ok to leave if stable.

## 2020-06-21 ENCOUNTER — Other Ambulatory Visit: Payer: Self-pay | Admitting: Internal Medicine

## 2020-06-24 ENCOUNTER — Other Ambulatory Visit: Payer: Self-pay | Admitting: Internal Medicine

## 2020-07-22 ENCOUNTER — Other Ambulatory Visit: Payer: Self-pay | Admitting: Internal Medicine

## 2020-08-09 ENCOUNTER — Ambulatory Visit (INDEPENDENT_AMBULATORY_CARE_PROVIDER_SITE_OTHER): Payer: PRIVATE HEALTH INSURANCE | Admitting: Obstetrics & Gynecology

## 2020-08-09 ENCOUNTER — Other Ambulatory Visit: Payer: Self-pay

## 2020-08-09 ENCOUNTER — Encounter (HOSPITAL_BASED_OUTPATIENT_CLINIC_OR_DEPARTMENT_OTHER): Payer: Self-pay | Admitting: Obstetrics & Gynecology

## 2020-08-09 ENCOUNTER — Other Ambulatory Visit (HOSPITAL_COMMUNITY)
Admission: RE | Admit: 2020-08-09 | Discharge: 2020-08-09 | Disposition: A | Discharge status: Home / Self Care | Payer: PRIVATE HEALTH INSURANCE | Source: Ambulatory Visit | Attending: Obstetrics & Gynecology | Admitting: Obstetrics & Gynecology

## 2020-08-09 VITALS — BP 160/83 | HR 66 | Ht 65.5 in | Wt 158.4 lb

## 2020-08-09 DIAGNOSIS — R030 Elevated blood-pressure reading, without diagnosis of hypertension: Secondary | ICD-10-CM

## 2020-08-09 DIAGNOSIS — Z23 Encounter for immunization: Secondary | ICD-10-CM | POA: Diagnosis not present

## 2020-08-09 DIAGNOSIS — Z124 Encounter for screening for malignant neoplasm of cervix: Secondary | ICD-10-CM

## 2020-08-09 DIAGNOSIS — F5101 Primary insomnia: Secondary | ICD-10-CM

## 2020-08-09 DIAGNOSIS — Z01419 Encounter for gynecological examination (general) (routine) without abnormal findings: Secondary | ICD-10-CM

## 2020-08-09 DIAGNOSIS — Z86010 Personal history of colonic polyps: Secondary | ICD-10-CM

## 2020-08-09 DIAGNOSIS — Z0000 Encounter for general adult medical examination without abnormal findings: Secondary | ICD-10-CM

## 2020-08-09 MED ORDER — ZOLPIDEM TARTRATE 5 MG PO TABS: 5.0000 mg | ORAL_TABLET | Freq: Every evening | ORAL | 1 refills | Status: DC | PRN

## 2020-08-09 NOTE — Progress Notes (Signed)
65 y.o. [id number] Married White or Caucasian female here for annual exam.  Denies vaginal bleeding.  Doing well.  Denies vaginal bleeding.  Needs tdap updated today.  Considering moving to [place] for retirement.  Going for two week trip this summer.  Patient's last menstrual period was 01/11/2014 (exact date).          Sexually active: Yes.    The current method of family planning is post menopausal status.    Exercising: no Smoker:  no, quite 2 years ago  Health Maintenance: Pap:  03/29/2018 Normal, Neg HPV History of abnormal Pap:  no MMG:  04/15/20 Colonoscopy:  04/02/19- 2 colon adenomas recall 2028 Hyperplastic gastric polyps Neg duodenal bxs BMD:   05/17/20 TDaP:  2012 Pneumonia vaccine(s):  pneumovax 2023 Shingrix:   did first shingles vaccines Hep C testing: 2017 Screening Labs: will plan with new PCP   reports that she quit smoking about 1 years ago. Her smoking use included cigarettes. She has a 20.00 pack-year smoking history. She has never used smokeless tobacco. She reports current alcohol use. She reports that she does not use drugs.  Past Medical History:  Diagnosis Date   Allergy    Anemia, iron deficiency    Constipation    Gastric polyps 03/2019   hyperplastic   GERD (gastroesophageal reflux disease)    Hiatal hernia    History of chickenpox    Hx of adenomatous colonic polyps 04/07/2019   Insomnia    Internal hemorrhoids    Kidney stone    Seasonal allergies    Tubular adenoma of colon 09/13/2011   removed from the decending colon    Past Surgical History:  Procedure Laterality Date   BREAST BIOPSY Left 2020   BUNIONECTOMY Right 12/15/2011   COLONOSCOPY     COLONOSCOPY W/ BIOPSIES  09/13/2011   ESOPHAGOGASTRODUODENOSCOPY  09/13/2011   TONSILLECTOMY  1964   UPPER GASTROINTESTINAL ENDOSCOPY      Current Outpatient Medications  Medication Sig Dispense Refill   Cholecalciferol (VITAMIN D3) 50 MCG (2000 UT) TABS Take 1 tablet by mouth daily.      fluticasone (FLONASE) 50 MCG/ACT nasal spray Place into both nostrils daily.     RABEprazole (ACIPHEX) 20 MG tablet TAKE ONE TABLET BY MOUTH ONE TIME DAILY 30 tablet 0   vitamin B-12 (CYANOCOBALAMIN) 100 MCG tablet Take 100 mcg by mouth daily.     zolpidem (AMBIEN) 5 MG tablet Take 1 tablet (5 mg total) by mouth at bedtime as needed for sleep. 30 tablet 2   famotidine (PEPCID) 20 MG tablet Take 1 tablet (20 mg total) by mouth at bedtime. (Patient not taking: Reported on 08/09/2020) 90 tablet 3   TURMERIC PO Take 1,000 mg by mouth. (Patient not taking: Reported on 08/09/2020)     No current facility-administered medications for this visit.    Family History  Problem Relation Age of Onset   Heart attack Mother    Hypertension Mother    Lung cancer Mother    Hearing loss Father    Drug abuse Brother    Cancer Paternal Grandmother        unsure type   Stroke Paternal Grandfather    Intellectual disability Paternal Aunt    Breast cancer Neg Hx    Colon cancer Neg Hx    Esophageal cancer Neg Hx    Rectal cancer Neg Hx    Stomach cancer Neg Hx     Review of Systems  All other systems reviewed and  are negative.  Exam:   BP (!) 160/83 (BP Location: Right Arm, Patient Position: Sitting, Cuff Size: Small)   Pulse 66   Ht 5' 5.5" (1.664 m)   Wt 158 lb 6.4 oz (71.8 kg)   LMP 01/11/2014 (Exact Date)   BMI 25.96 kg/m   Height: 5' 5.5" (166.4 cm)  General appearance: alert, cooperative and appears stated age Head: Normocephalic, without obvious abnormality, atraumatic Neck: no adenopathy, supple, symmetrical, trachea midline and thyroid normal to inspection and palpation Lungs: clear to auscultation bilaterally Breasts: normal appearance, no masses or tenderness Heart: regular rate and rhythm Abdomen: soft, non-tender; bowel sounds normal; no masses,  no organomegaly Extremities: extremities normal, atraumatic, no cyanosis or edema Skin: Skin color, texture, turgor normal. No rashes or  lesions Lymph nodes: Cervical, supraclavicular, and axillary nodes normal. No abnormal inguinal nodes palpated Neurologic: Grossly normal   Pelvic: External genitalia:  no lesions              Urethra:  normal appearing urethra with no masses, tenderness or lesions              Bartholins and Skenes: normal                 Vagina: normal appearing vagina with normal color and no discharge, no lesions              Cervix: no lesions              Pap taken: No. Bimanual Exam:  Uterus:  normal size, contour, position, consistency, mobility, non-tender              Adnexa: normal adnexa and no mass, fullness, tenderness               Rectovaginal: Confirms               Anus:  normal sphincter tone, no lesions  Chaperone, Octaviano Batty, CMA, was present for exam.  Assessment/Plan: 1. Well woman exam with routine gynecological exam - Pap and HR HPV obtained today - MMG up to date - Colonoscopy 2021.  Follow up 7 years. - Tdap will be given today - vaccines updated - Lab work ordered today  2. Elevated BP without diagnosis of hypertension - pt will monitor and plan follow up with new PCP  3. Blood tests for routine general physical examination - CBC - Comprehensive metabolic panel - Hemoglobin A1c - TSH - Lipid panel - B12  4. Primary insomnia - Ambien 5mg  qhs prn insomnia.  #30/1RF  5. Hx of adenomatous colonic polyps

## 2020-08-10 LAB — LIPID PANEL
Chol/HDL Ratio: 3.7 ratio (ref 0.0–4.4)
Cholesterol, Total: 253 mg/dL — ABNORMAL HIGH (ref 100–199)
HDL: 68 mg/dL (ref >39)
LDL Chol Calc (NIH): 167 mg/dL — ABNORMAL HIGH (ref 0–99)
Triglycerides: 104 mg/dL (ref 0–149)
VLDL Cholesterol Cal: 18 mg/dL (ref 5–40)

## 2020-08-10 LAB — COMPREHENSIVE METABOLIC PANEL
ALT: 42 IU/L — ABNORMAL HIGH (ref 0–32)
AST: 25 IU/L (ref 0–40)
Albumin/Globulin Ratio: 2 (ref 1.2–2.2)
Albumin: 4.8 g/dL (ref 3.8–4.8)
Alkaline Phosphatase: 95 IU/L (ref 44–121)
BUN/Creatinine Ratio: 13 (ref 12–28)
BUN: 10 mg/dL (ref 8–27)
Bilirubin Total: 0.4 mg/dL (ref 0.0–1.2)
CO2: 19 mmol/L — ABNORMAL LOW (ref 20–29)
Calcium: 9.8 mg/dL (ref 8.7–10.3)
Chloride: 103 mmol/L (ref 96–106)
Creatinine, Ser: 0.79 mg/dL (ref 0.57–1.00)
Globulin, Total: 2.4 g/dL (ref 1.5–4.5)
Glucose: 129 mg/dL — ABNORMAL HIGH (ref 65–99)
Potassium: 4.5 mmol/L (ref 3.5–5.2)
Sodium: 139 mmol/L (ref 134–144)
Total Protein: 7.2 g/dL (ref 6.0–8.5)
eGFR: 84 mL/min/{1.73_m2} (ref >59)

## 2020-08-10 LAB — CBC
Hematocrit: 42.8 % (ref 34.0–46.6)
Hemoglobin: 14.3 g/dL (ref 11.1–15.9)
MCH: 32.1 pg (ref 26.6–33.0)
MCHC: 33.4 g/dL (ref 31.5–35.7)
MCV: 96 fL (ref 79–97)
Platelets: 222 10*3/uL (ref 150–450)
RBC: 4.45 x10E6/uL (ref 3.77–5.28)
RDW: 12.1 % (ref 11.7–15.4)
WBC: 8 10*3/uL (ref 3.4–10.8)

## 2020-08-10 LAB — TSH: TSH: 1.4 u[IU]/mL (ref 0.450–4.500)

## 2020-08-10 LAB — HEMOGLOBIN A1C
Est. average glucose Bld gHb Est-mCnc: 117 mg/dL
Hgb A1c MFr Bld: 5.7 % — ABNORMAL HIGH (ref 4.8–5.6)

## 2020-08-10 LAB — VITAMIN B12: Vitamin B-12: 1122 pg/mL (ref 232–1245)

## 2020-08-11 LAB — CYTOLOGY - PAP
Comment: NEGATIVE
Diagnosis: NEGATIVE
High risk HPV: NEGATIVE

## 2020-08-31 ENCOUNTER — Encounter (HOSPITAL_BASED_OUTPATIENT_CLINIC_OR_DEPARTMENT_OTHER): Payer: Self-pay | Admitting: Nurse Practitioner

## 2020-08-31 ENCOUNTER — Other Ambulatory Visit: Payer: Self-pay

## 2020-08-31 ENCOUNTER — Ambulatory Visit (INDEPENDENT_AMBULATORY_CARE_PROVIDER_SITE_OTHER): Payer: PRIVATE HEALTH INSURANCE | Admitting: Nurse Practitioner

## 2020-08-31 VITALS — BP 140/74 | HR 79 | Ht 66.0 in | Wt 154.4 lb

## 2020-08-31 DIAGNOSIS — E782 Mixed hyperlipidemia: Secondary | ICD-10-CM | POA: Diagnosis not present

## 2020-08-31 DIAGNOSIS — K219 Gastro-esophageal reflux disease without esophagitis: Secondary | ICD-10-CM

## 2020-08-31 DIAGNOSIS — R7401 Elevation of levels of liver transaminase levels: Secondary | ICD-10-CM | POA: Diagnosis not present

## 2020-08-31 DIAGNOSIS — E559 Vitamin D deficiency, unspecified: Secondary | ICD-10-CM | POA: Diagnosis not present

## 2020-08-31 DIAGNOSIS — Z8261 Family history of arthritis: Secondary | ICD-10-CM

## 2020-08-31 DIAGNOSIS — Z0000 Encounter for general adult medical examination without abnormal findings: Secondary | ICD-10-CM | POA: Insufficient documentation

## 2020-08-31 DIAGNOSIS — M25542 Pain in joints of left hand: Secondary | ICD-10-CM | POA: Insufficient documentation

## 2020-08-31 NOTE — Assessment & Plan Note (Signed)
Left thumb pain- family hx of RA Pain appears to be limited to the thumb with no other associated sx. We will test for RF and ANA with labs to monitor.  Ice and diclofenac gel can be helpful for pain.  No signs of nodularity or deformity present on exam.

## 2020-08-31 NOTE — Patient Instructions (Addendum)
Recommendations from today's visit: We will plan to have you come back for lab testing at your convenience.  If your symptoms or GERD worsen, please let me know.  If your thumb pain seems to worsen or not improve let me know. We will test for the RA factor to see if this is present.   Information on diet, exercise, and health maintenance recommendations are listed below. This is information to help you be sure you are on track for optimal health and monitoring.   Please look over this and let us know if you have any questions or if you have completed any of the health maintenance outside of [place] so that we can be sure your records are up to date.  ___________________________________________________________  Thank you for choosing [facility] for your Primary Care needs. I am excited for the opportunity to partner with you to meet your health care goals. It was a pleasure meeting you today!  I am an Adult-Geriatric Nurse Practitioner with a background in caring for patients for more than 20 years. I received my Paediatric nurse in Nursing and my Doctor of Nursing Practice degrees at [employer]. I received additional fellowship training in primary care and sports medicine after receiving my doctorate degree. I provide primary care and sports medicine services to patients age 47 and older within this office. I am also a provider with the [facility] and the director of the APP Fellowship with [facility].  I am a [place] native, but have called the [place] area home for nearly 20 years and am proud to be a member of this community.   I am passionate about providing the best service to you through preventive medicine and supportive care. I consider you a part of the medical team and value your input. I work diligently to ensure that you are heard and your needs are met in a safe and effective manner. I want you to  feel comfortable with me as your provider and want you to know that your health concerns are important to me.   For your information, our office hours are Monday- Friday 8:00 AM - 5:00 PM At this time I am not in the office on Wednesdays.  If you have questions or concerns, please call our office at [phone] or send [place] a MyChart message and we will respond as quickly as possible.   For all urgent or time sensitive needs we ask that you please call the office to avoid delays. MyChart is not constantly monitored and replies may take up to 72 business hours.  MyChart Policy: MyChart allows for you to see your visit notes, after visit summary, provider recommendations, lab and tests results, make an appointment, request refills, and contact your provider or the office for non-urgent questions or concerns.  Providers are seeing patients during normal business hours and do not have built in time to review MyChart messages. We ask that you allow a minimum of 72 business hours for MyChart message responses.  Complex MyChart concerns may require a visit. Your provider may request you schedule a virtual or in person visit to ensure we are providing the best care possible. MyChart messages sent after 4:00 PM on Friday will not be received by the provider until Monday morning.    Lab and Test Results: You will receive your lab and test results on MyChart as soon as they are completed and results have been  sent by the lab or testing facility. Due to this service, you will receive your results BEFORE your provider.  Please allow a minimum of 72 business hours for your provider to receive and review lab and test results and contact you about.   Most lab and test result comments from the provider will be sent through [place]. Your provider may recommend changes to the plan of care, follow-up visits, repeat testing, ask questions, or request an office visit to discuss these results. You may reply directly to  this message or call the office at [phone] to provide information for the provider or set up an appointment. In some instances, you will be called with test results and recommendations. Please let us know if this is preferred and we will make note of this in your chart to provide this for you.    If you have not heard a response to your lab or test results in 72 business hours, please call the office to let us know.   After Hours: For all non-emergency after hours needs, please call the office at [phone] and select the option to reach the on-call provider service. On-call services are shared between multiple [facility] offices and therefore it will not be possible to speak directly with your provider. On-call providers may provide medical advice and recommendations, but are unable to provide refills for maintenance medications.  For all emergency or urgent medical needs after normal business hours, we recommend that you seek care at the closest Urgent Care or Emergency Department to ensure appropriate treatment in a timely manner.  [facility] has a 24 hour emergency room located on the ground floor for your convenience.    Please do not hesitate to reach out to [place] with concerns.   Thank you, again, for choosing me as your health care partner. I appreciate your trust and look forward to learning more about you.   [name], DNP, AGNP-c ___________________________________________________________  Health Maintenance Recommendations Screening Testing Mammogram Every 1 -2 years based on history and risk factors Starting at age 45 Pap Smear Ages 21-39 every 3 years Ages 75-65 every 5 years with HPV testing More frequent testing may be required based on results and history Colon Cancer Screening Every 1-10 years based on test performed, risk factors, and history Starting at age 36 Bone Density Screening Every 2-10 years based on history Starting at age  40 for women Recommendations for men differ based on medication usage, history, and risk factors AAA Screening One time ultrasound Men 72-55 years old who have every smoked Lung Cancer Screening Low Dose Lung CT every 12 months Age 49-80 years with a 30 pack-year smoking history who still smoke or who have quit within the last 15 years  Screening Labs Routine  Labs: Complete Blood Count (CBC), Complete Metabolic Panel (CMP), Cholesterol (Lipid Panel) Every 6-12 months based on history and medications May be recommended more frequently based on current conditions or previous results Hemoglobin A1c Lab Every 3-12 months based on history and previous results Starting at age 31 or earlier with diagnosis of diabetes, high cholesterol, BMI >26, and/or risk factors Frequent monitoring for patients with diabetes to ensure blood sugar control Thyroid Panel (TSH w/ T3 & T4) Every 6 months based on history, symptoms, and risk factors May be repeated more often if on medication HIV One time testing for all patients 2 and older May be repeated more frequently for patients with increased risk factors or exposure Hepatitis C  One time testing for all patients 41 and older May be repeated more frequently for patients with increased risk factors or exposure Gonorrhea, Chlamydia Every 12 months for all sexually active persons 13-24 years Additional monitoring may be recommended for those who are considered high risk or who have symptoms PSA Men 83-60 years old with risk factors Additional screening may be recommended from age 43-69 based on risk factors, symptoms, and history  Vaccine Recommendations Tetanus Booster All adults every 10 years Flu Vaccine All patients 6 months and older every year COVID Vaccine All patients 12 years and older Initial dosing with booster May recommend additional booster based on age and health history HPV Vaccine 2 doses all patients age 34-26 Dosing may be  considered for patients over 26 Shingles Vaccine (Shingrix) 2 doses all adults 29 years and older Pneumonia (Pneumovax 23) All adults 5 years and older May recommend earlier dosing based on health history Pneumonia (Prevnar 92) All adults 55 years and older Dosed 1 year after Pneumovax 23  Additional Screening, Testing, and Vaccinations may be recommended on an individualized basis based on family history, health history, risk factors, and/or exposure.  __________________________________________________________  Diet Recommendations for All Patients  I recommend that all patients maintain a diet low in saturated fats, carbohydrates, and cholesterol. While this can be challenging at first, it is not impossible and small changes can make big differences.  Things to try: Decreasing the amount of soda, sweet tea, and/or juice to one or less per day and replace with water While water is always the first choice, if you do not like water you may consider adding a water additive without sugar to improve the taste other sugar free drinks Replace potatoes with a brightly colored vegetable at dinner Use healthy oils, such as canola oil or olive oil, instead of butter or hard margarine Limit your bread intake to two pieces or less a day Replace regular pasta with low carb pasta options Bake, broil, or grill foods instead of frying Monitor portion sizes  Eat smaller, more frequent meals throughout the day instead of large meals  An important thing to remember is, if you love foods that are not great for your health, you don't have to give them up completely. Instead, allow these foods to be a reward when you have done well. Allowing yourself to still have special treats every once in a while is a nice way to tell yourself thank you for working hard to keep yourself healthy.   Also remember that every day is a new day. If you have a bad day and "fall off the wagon", you can still climb right back  up and keep moving along on your journey!  We have resources available to help you!  Some websites that may be helpful include: www.http://carter.biz/  Www.VeryWellFit.com _____________________________________________________________  Activity Recommendations for All Patients  I recommend that all adults get at least 20 minutes of moderate physical activity that elevates your heart rate at least 5 days out of the week.  Some examples include: Walking or jogging at a pace that allows you to carry on a conversation Cycling (stationary bike or outdoors) Water aerobics Yoga Weight lifting Dancing If physical limitations prevent you from putting stress on your joints, exercise in a pool or seated in a chair are excellent options.  Do determine your MAXIMUM heart rate for activity: YOUR AGE - 220 = MAX HeartRate   Remember! Do not push yourself too hard.  Start slowly and  build up your pace, speed, weight, time in exercise, etc.  Allow your body to rest between exercise and get good sleep. You will need more water than normal when you are exerting yourself. Do not wait until you are thirsty to drink. Drink with a purpose of getting in at least 8, 8 ounce glasses of water a day plus more depending on how much you exercise and sweat.    If you begin to develop dizziness, chest pain, abdominal pain, jaw pain, shortness of breath, headache, vision changes, lightheadedness, or other concerning symptoms, stop the activity and allow your body to rest. If your symptoms are severe, seek emergency evaluation immediately. If your symptoms are concerning, but not severe, please let us know so that we can recommend further evaluation.   ________________________________________________________________

## 2020-08-31 NOTE — Progress Notes (Signed)
Tara Keeler, DNP, AGNP-c Primary Care Services ______________________________________________________________________  HPI Tara Hendrix is a 65 y.o. female presenting to [facility] at [place] today to establish care.   Patient Care Team: Decklyn Hyder, Coralee Pesa, NP as PCP - General (Nurse Practitioner) Megan Salon, MD as Consulting Physician (Gynecology) Lavonna Monarch, MD as Consulting Physician (Dermatology)  Health Maintenance  Topic Date Due   Zoster (Shingles) Vaccine (1 of 2) Never done   COVID-19 Vaccine (4 - Booster for [place] series) 03/24/2020   Flu Shot  09/27/2020   Mammogram  04/15/2021   Pap Smear  08/10/2023   Colon Cancer Screening  04/01/2026   Tetanus Vaccine  08/10/2030   Hepatitis C Screening: USPSTF Recommendation to screen - Ages 18-64 yo.  Completed   HIV Screening  Completed   HPV Vaccine  Aged Out   Pneumococcal Vaccination  Discontinued   Prefers a holistic approach to medicine. Taking supplements and using natural remedies to help with conditions.  She is very active and monitors her diet closely.  Concerns today: Thumb pain She endorses thumb pain, specifically in the PIP joint of the right thumb that has been intermittent.  Mother has RA Would like to be tested to see if this is a concern for her or if this pain is something else Pain worse with use- not worse on awakening No other joint pain, fevers, chills, muscle weakness Cholesterol She endorses elevated cholesterol levels. She would like to have these rechecked to see if dietary changes help lower the LDL Not on any medications Father has HLD Hiatal Hernia She endorses hx of hiatal hernia with past use of PPI daily.  Last EGD last year She has stopped PPI and is monitoring diet Started chiropractic care for hernia which she feels has been helpful Symptoms are improving and occurring less than once per day Able to use tums or famotidine  when symptoms present No alarm symptoms present, blood in stool, black stool, or emesis.   Patient Active Problem List   Diagnosis Date Noted   Encounter for medical examination to establish care 08/31/2020   Arthralgia of left hand 08/31/2020   Hx of adenomatous colonic polyps 04/07/2019   Chronic GERD 05/01/2018    PHQ9 Today: Depression screen [facility] 2/9 08/31/2020 08/09/2020 12/27/2018  Decreased Interest 0 0 0  Down, Depressed, Hopeless 0 0 0  PHQ - 2 Score 0 0 0  Altered sleeping 2 - -  Tired, decreased energy 1 - -  Change in appetite 0 - -  Feeling bad or failure about yourself  0 - -  Trouble concentrating 0 - -  Moving slowly or fidgety/restless 0 - -  Suicidal thoughts 0 - -  PHQ-9 Score 3 - -  Difficult doing work/chores Not difficult at all - -   GAD7 Today: GAD 7 : Generalized Anxiety Score 08/31/2020  Nervous, Anxious, on Edge 1  Control/stop worrying 1  Worry too much - different things 1  Trouble relaxing 0  Restless 0  Easily annoyed or irritable 1  Afraid - awful might happen 1  Total GAD 7 Score 5  Anxiety Difficulty Not difficult at all   ______________________________________________________________________ PMH Past Medical History:  Diagnosis Date   Allergy    Anemia, iron deficiency    Constipation    Gastric polyps 03/2019   hyperplastic   GERD (gastroesophageal reflux disease)    Hiatal hernia    History of chickenpox    Hx  of adenomatous colonic polyps 04/07/2019   Insomnia    Internal hemorrhoids    Kidney stone    Seasonal allergies    Tubular adenoma of colon 09/13/2011   removed from the decending colon    ROS All review of systems negative except what is listed in the HPI  PHYSICAL EXAM Physical Exam Vitals and nursing note reviewed.  Constitutional:      Appearance: Normal appearance. She is normal weight.  HENT:     Head: Normocephalic and atraumatic.     Right Ear: Tympanic membrane normal.     Left Ear: Tympanic membrane  normal.     Nose: Nose normal.     Mouth/Throat:     Mouth: Mucous membranes are moist.     Pharynx: Oropharynx is clear.  Eyes:     Extraocular Movements: Extraocular movements intact.     Conjunctiva/sclera: Conjunctivae normal.     Pupils: Pupils are equal, round, and reactive to light.  Neck:     Vascular: No carotid bruit.  Cardiovascular:     Rate and Rhythm: Normal rate and regular rhythm.     Pulses: Normal pulses.     Heart sounds: Normal heart sounds.  Pulmonary:     Effort: Pulmonary effort is normal.     Breath sounds: Normal breath sounds.  Abdominal:     General: Abdomen is flat. Bowel sounds are normal. There is no distension.     Palpations: Abdomen is soft. There is no mass.     Tenderness: There is no abdominal tenderness. There is no right CVA tenderness, left CVA tenderness, guarding or rebound.  Musculoskeletal:        General: Normal range of motion.     Cervical back: Normal range of motion and neck supple.     Right lower leg: No edema.     Left lower leg: No edema.  Skin:    General: Skin is warm and dry.     Capillary Refill: Capillary refill takes less than 2 seconds.  Neurological:     General: No focal deficit present.     Mental Status: She is alert and oriented to person, place, and time.  Psychiatric:        Mood and Affect: Mood normal.        Behavior: Behavior normal.        Thought Content: Thought content normal.        Judgment: Judgment normal.   ______________________________________________________________________ ASSESSMENT AND PLAN Problem List Items Addressed This Visit     Chronic GERD    Current management with PRN medication and chiropractic manipulation which has been helpful to reduce symptoms. Discussed warning signs that would warrant further evaluation Recommend f/u with GI as directed If symptoms worsen, please notify immediately.        Encounter for medical examination to establish care - Primary    Review of  current and past medical history, social history, medication, and family history.  Review of care gaps and health maintenance recommendations.  Records from recent providers to be requested if not available in Chart Review or Care Everywhere.  Recommendations for health maintenance, diet, and exercise provided.  Shingles vaccine completed (Zostavax) - does not wish to have Shingrix vaccine at this time UTD on all HM recommendations.  Will plan for labs in 6 weeks.        Arthralgia of left hand    Left thumb pain- family hx of RA Pain appears to be limited  to the thumb with no other associated sx. We will test for RF and ANA with labs to monitor.  Ice and diclofenac gel can be helpful for pain.  No signs of nodularity or deformity present on exam.        Relevant Orders   Rheumatoid Factor   ANA   Other Visit Diagnoses     Elevated ALT measurement       Relevant Orders   Hepatic function panel   Lipid panel   Mixed hyperlipidemia       Relevant Orders   Hepatic function panel   Lipid panel   Vitamin D deficiency       Relevant Orders   VITAMIN D 25 Hydroxy (Vit-D Deficiency, Fractures)   Family history of rheumatoid arthritis       Relevant Orders   Rheumatoid Factor   ANA       Education provided today during visit and on AVS for patient to review at home.  Diet and Exercise recommendations provided.  Current diagnoses and recommendations discussed. HM recommendations reviewed with recommendations.    Outpatient Encounter Medications as of 08/31/2020  Medication Sig   Cholecalciferol (VITAMIN D3) 50 MCG (2000 UT) TABS Take 1 tablet by mouth daily.   famotidine (PEPCID) 20 MG tablet Take 1 tablet (20 mg total) by mouth at bedtime.   fluticasone (FLONASE) 50 MCG/ACT nasal spray Place into both nostrils daily.   L-Glutamine 500 MG CAPS Take by mouth.   Licorice, Glycyrrhiza glabra, (LICORICE ROOT PO) Take 900 mg by mouth 2 (two) times daily.   vitamin B-12  (CYANOCOBALAMIN) 100 MCG tablet Take 100 mcg by mouth daily.   zolpidem (AMBIEN) 5 MG tablet Take 1 tablet (5 mg total) by mouth at bedtime as needed for sleep.   [DISCONTINUED] RABEprazole (ACIPHEX) 20 MG tablet TAKE ONE TABLET BY MOUTH ONE TIME DAILY   [DISCONTINUED] TURMERIC PO Take 1,000 mg by mouth.   No facility-administered encounter medications on file as of 08/31/2020.    Return for CPE today- Nurse visit for labs and BP check at convenience. / CPE in 1 year.  Time: 55 minutes, >50% spent counseling, care coordination, chart review, and documentation.   Orma Render, DNP, AGNP-c

## 2020-08-31 NOTE — Assessment & Plan Note (Signed)
Review of current and past medical history, social history, medication, and family history.  Review of care gaps and health maintenance recommendations.  Records from recent providers to be requested if not available in Chart Review or Care Everywhere.  Recommendations for health maintenance, diet, and exercise provided.  Shingles vaccine completed (Zostavax) - does not wish to have Shingrix vaccine at this time UTD on all HM recommendations.  Will plan for labs in 6 weeks.

## 2020-08-31 NOTE — Assessment & Plan Note (Signed)
Current management with PRN medication and chiropractic manipulation which has been helpful to reduce symptoms. Discussed warning signs that would warrant further evaluation Recommend f/u with GI as directed If symptoms worsen, please notify immediately.

## 2020-09-23 ENCOUNTER — Ambulatory Visit (INDEPENDENT_AMBULATORY_CARE_PROVIDER_SITE_OTHER): Payer: PRIVATE HEALTH INSURANCE | Admitting: Nurse Practitioner

## 2020-09-23 ENCOUNTER — Other Ambulatory Visit: Payer: Self-pay

## 2020-09-23 DIAGNOSIS — E559 Vitamin D deficiency, unspecified: Secondary | ICD-10-CM

## 2020-09-23 DIAGNOSIS — Z8261 Family history of arthritis: Secondary | ICD-10-CM

## 2020-09-23 DIAGNOSIS — E782 Mixed hyperlipidemia: Secondary | ICD-10-CM

## 2020-09-23 DIAGNOSIS — M25542 Pain in joints of left hand: Secondary | ICD-10-CM

## 2020-09-23 DIAGNOSIS — R7401 Elevation of levels of liver transaminase levels: Secondary | ICD-10-CM

## 2020-09-23 NOTE — Progress Notes (Signed)
Nurse visit for labs and bp check

## 2020-09-24 LAB — RHEUMATOID FACTOR: Rheumatoid fact SerPl-aCnc: <10 IU/mL (ref <14.0)

## 2020-09-24 LAB — HEPATIC FUNCTION PANEL
ALT: 27 IU/L (ref 0–32)
AST: 23 IU/L (ref 0–40)
Albumin: 4.6 g/dL (ref 3.8–4.8)
Alkaline Phosphatase: 81 IU/L (ref 44–121)
Bilirubin Total: 0.5 mg/dL (ref 0.0–1.2)
Bilirubin, Direct: 0.11 mg/dL (ref 0.00–0.40)
Total Protein: 6.7 g/dL (ref 6.0–8.5)

## 2020-09-24 LAB — LIPID PANEL
Chol/HDL Ratio: 4.5 ratio — ABNORMAL HIGH (ref 0.0–4.4)
Cholesterol, Total: 274 mg/dL — ABNORMAL HIGH (ref 100–199)
HDL: 61 mg/dL (ref >39)
LDL Chol Calc (NIH): 188 mg/dL — ABNORMAL HIGH (ref 0–99)
Triglycerides: 136 mg/dL (ref 0–149)
VLDL Cholesterol Cal: 25 mg/dL (ref 5–40)

## 2020-09-24 LAB — VITAMIN D 25 HYDROXY (VIT D DEFICIENCY, FRACTURES): Vit D, 25-Hydroxy: 48.8 ng/mL (ref 30.0–100.0)

## 2020-09-30 LAB — ANA: ANA Titer 1: NEGATIVE

## 2020-09-30 NOTE — Progress Notes (Signed)
Liver panel has returned to normal from last check. No changes needed at this time.   Rheumatoid factor was normal- no signs of rheumatic conditions present.   Vitamin D looks great. No changes needed.   Overall cholesterol and bad cholesterol (LDL) are elevated. Good cholesterol (HDL) helps to offset the increase in bad cholesterol, but I would like to see a better balance.  I recommend monitoring the amount of saturated fats in your diet and reduce fatty and fried foods while increasing your fiber to help prevent heart attack and stroke. Walking at least 20 minutes every day can be very beneficial also.  The 10-year ASCVD risk score Mikey Bussing DC Brooke Bonito., et al., 2013) is: 8.6%- this is your estimated risk of cardiovascular disease in the next 10 years based on your labs, blood pressure readings, and smoking status.  We can lower this risk with medication for your cholesterol called a "statin" and better blood pressure control. I would like you to work on these on your own before we start medications, though.   I would like for you to work on these things and see you back in about 6 months for a follow-up on your blood pressure and cholesterol. Please call the office [phone]) and schedule a 6 month follow-up for blood pressure and cholesterol, unless you already have an appointment scheduled between now and then.   SaraBeth

## 2020-10-05 ENCOUNTER — Telehealth (HOSPITAL_BASED_OUTPATIENT_CLINIC_OR_DEPARTMENT_OTHER): Payer: Self-pay

## 2020-10-05 NOTE — Telephone Encounter (Signed)
-----   Message from Orma Render, NP sent at 09/30/2020  8:06 AM EDT ----- Liver panel has returned to normal from last check. No changes needed at this time.   Rheumatoid factor was normal- no signs of rheumatic conditions present.   Vitamin D looks great. No changes needed.   Overall cholesterol and bad cholesterol (LDL) are elevated. Good cholesterol (HDL) helps to offset the increase in bad cholesterol, but I would like to see a better balance.  I recommend monitoring the amount of saturated fats in your diet and reduce fatty and fried foods while increasing your fiber to help prevent heart attack and stroke. Walking at least 20 minutes every day can be very beneficial also.  The 10-year ASCVD risk score Mikey Bussing DC Brooke Bonito., et al., 2013) is: 8.6%- this is your estimated risk of cardiovascular disease in the next 10 years based on your labs, blood pressure readings, and smoking status.  We can lower this risk with medication for your cholesterol called a "statin" and better blood pressure control. I would like you to work on these on your own before we start medications, though.   I would like for you to work on these things and see you back in about 6 months for a follow-up on your blood pressure and cholesterol. Please call the office [phone]) and schedule a 6 month follow-up for blood pressure and cholesterol, unless you already have an appointment scheduled between now and then.   SaraBeth

## 2020-10-05 NOTE — Telephone Encounter (Signed)
Results released by Sarabeth Early, AGNP and reviewed by patient via MyChart. Instructed patient to contact the office with any questions or concerns.  

## 2020-10-06 NOTE — Progress Notes (Signed)
I finally have all of your lab results back. Your ANA was normal- so that is good news. Based on the results, I suspect that your thumb pain may be a result of arthritis. Diclofenac gel is a rub that you can buy over the counter and apply daily to the thumb that can help with this type of pain. It does not have any odor and works like ibuprofen, but does not harm the kidneys or stomach since it does not have to be digested. This may help with your symptoms.

## 2020-10-08 ENCOUNTER — Ambulatory Visit: Payer: Self-pay

## 2021-01-14 IMAGING — MG STEREOTACTIC CORE NEEDLE BIOPSY
8 of 10 series · 8 of 18 positions shown · non-contrast
Comparison: Previous exams.

Addendum:
CLINICAL DATA: 62-year-old female with indeterminate left breast
calcifications.

EXAM:
LEFT BREAST STEREOTACTIC CORE NEEDLE BIOPSY

[L (1 of 7)]
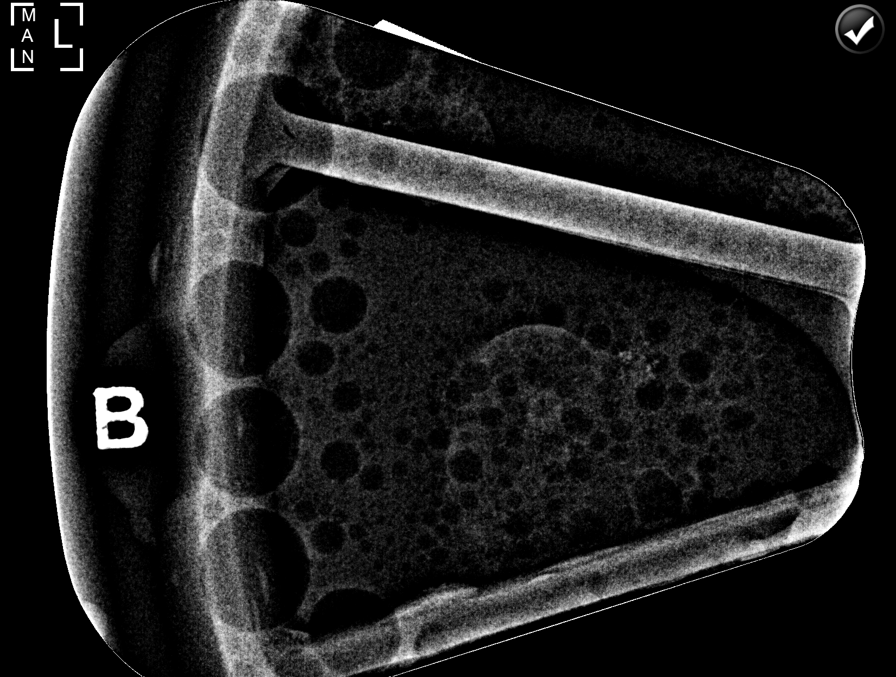

[L (2 of 7)]
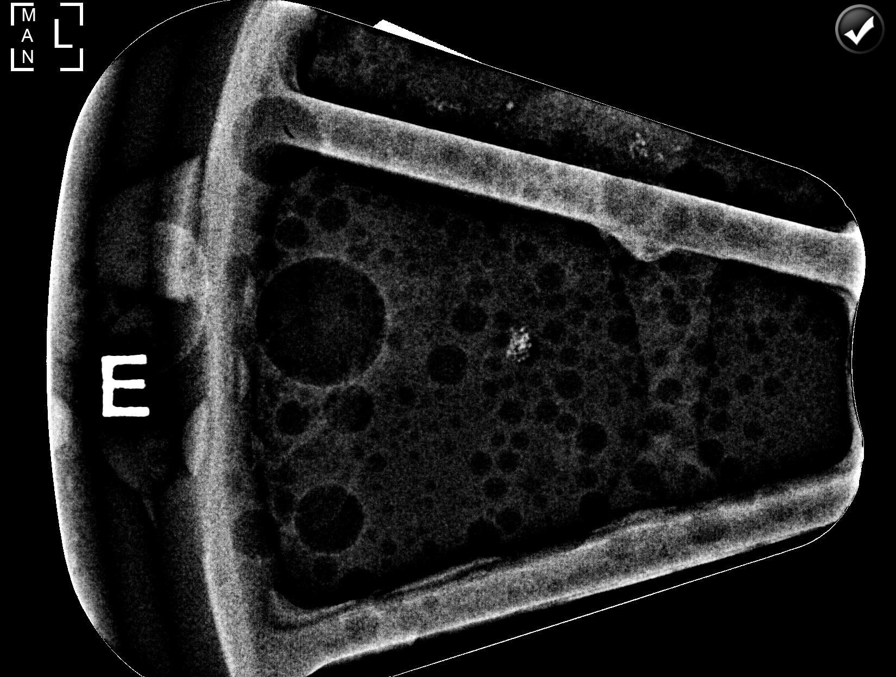

[L (3 of 7)]
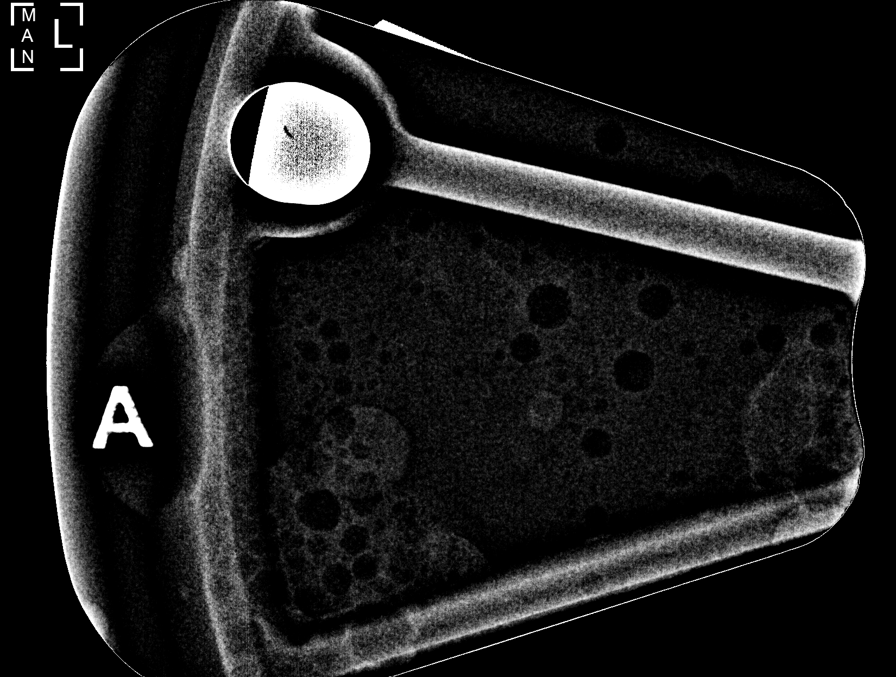

[L (4 of 7)]
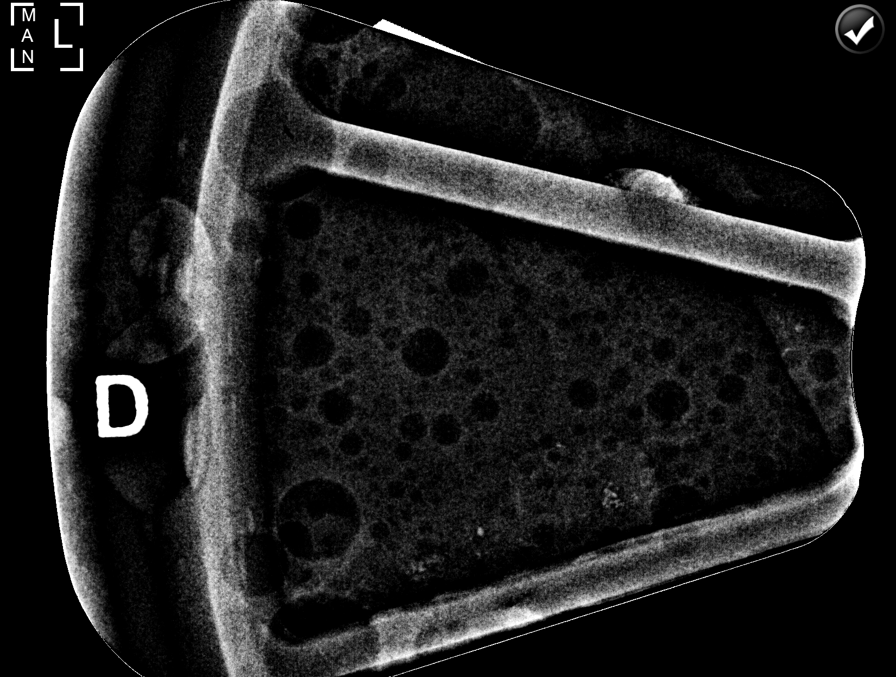

[L (5 of 7)]
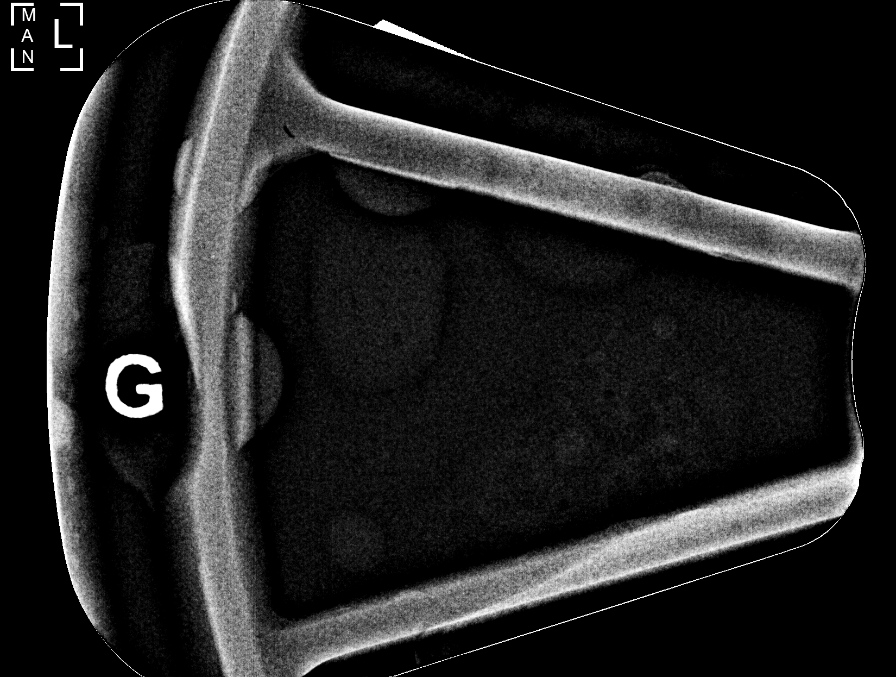

[L (6 of 7)]
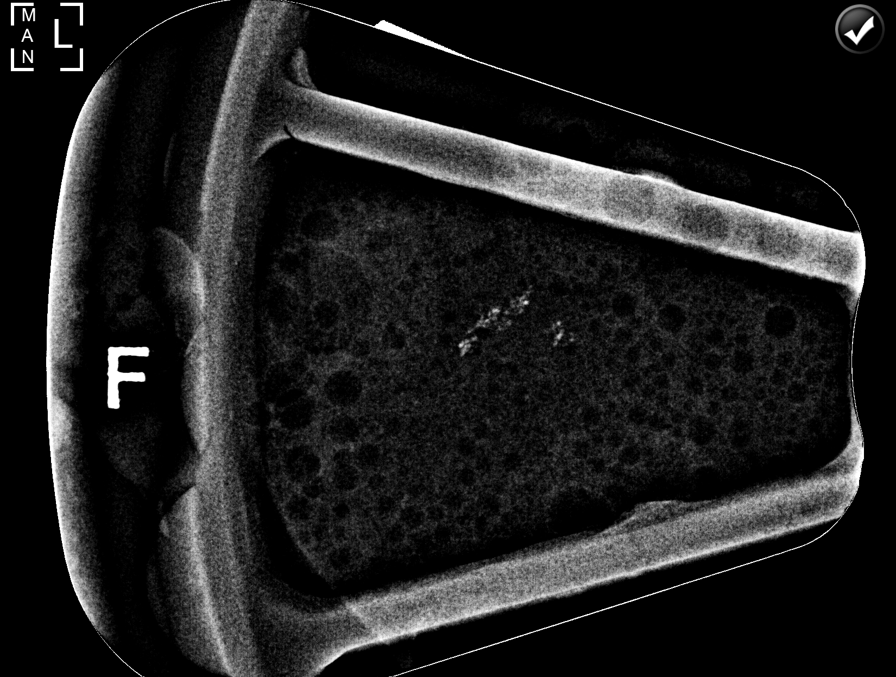

[L (7 of 7)]
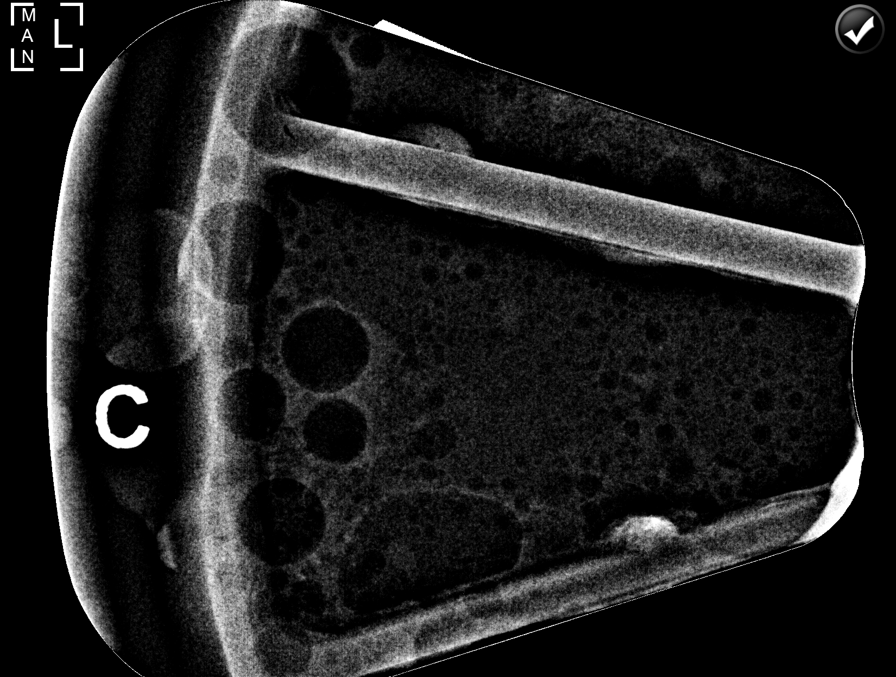

[L CC]
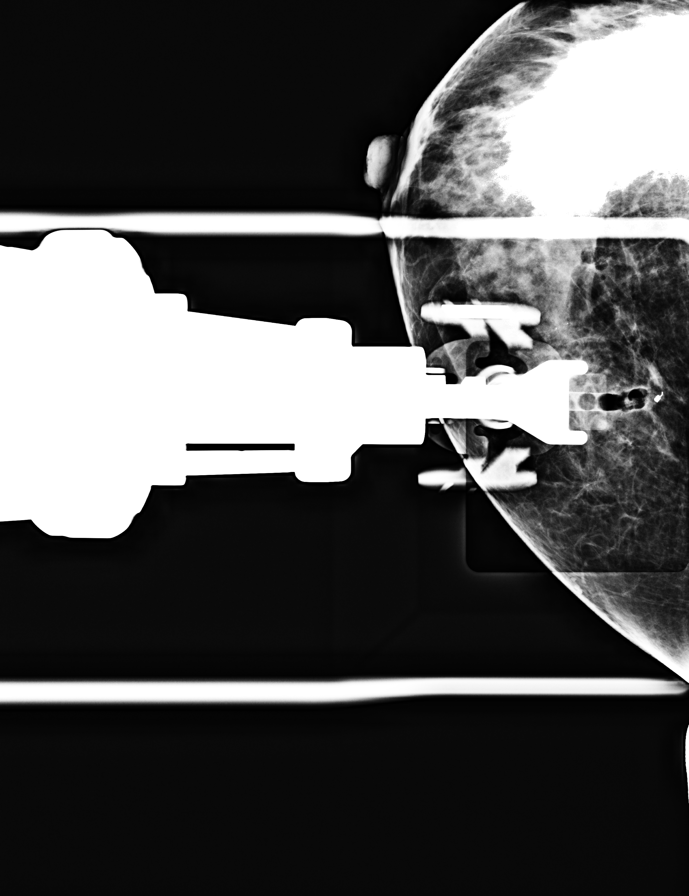

[8 of 18 positions shown; findings below may reference images not displayed]



Using sterile technique and 1% Lidocaine as local anesthetic, under
stereotactic guidance, a 9 gauge vacuum assisted device was used to
perform core needle biopsy of calcifications in the upper-outer
quadrant of the left breast using a superior approach. Specimen
radiograph was performed showing calcifications in several
specimens. Specimens with calcifications are identified for
pathology.

Lesion quadrant: Upper outer quadrant

At the conclusion of the procedure, a coil shaped tissue marker clip
was deployed into the biopsy cavity. Follow-up 2-view mammogram was
performed and dictated separately.
IMPRESSION: Stereotactic-guided biopsy of left breast calcifications. No
apparent complications.

ADDENDUM:
Pathology revealed FIBROCYSTIC CHANGES WITH ADENOSIS AND
CALCIFICATIONS of the LEFT breast, upper outer quadrant, posterior.
This was found to be concordant by Dr. Klpigbb Moolman.

Pathology results were discussed with the patient by telephone. The
patient reported doing well after the biopsy with tenderness at the
site. Post biopsy instructions and care were reviewed and questions
were answered. The patient was encouraged to call The [REDACTED]

The patient was instructed to return for annual diagnostic
mammography and informed a reminder notice would be sent regarding
this appointment.

Pathology results reported by Ida Oline Hasleengen, RN on 04/09/2018.

*** End of Addendum ***

## 2021-02-24 ENCOUNTER — Encounter (HOSPITAL_BASED_OUTPATIENT_CLINIC_OR_DEPARTMENT_OTHER): Payer: Self-pay | Admitting: Nurse Practitioner

## 2021-02-24 DIAGNOSIS — Z298 Encounter for other specified prophylactic measures: Secondary | ICD-10-CM

## 2021-03-08 ENCOUNTER — Ambulatory Visit (HOSPITAL_BASED_OUTPATIENT_CLINIC_OR_DEPARTMENT_OTHER): Payer: PRIVATE HEALTH INSURANCE | Admitting: Nurse Practitioner

## 2021-03-08 ENCOUNTER — Other Ambulatory Visit: Payer: Self-pay

## 2021-03-08 DIAGNOSIS — E782 Mixed hyperlipidemia: Secondary | ICD-10-CM

## 2021-03-09 ENCOUNTER — Other Ambulatory Visit: Payer: Self-pay | Admitting: Obstetrics & Gynecology

## 2021-03-09 DIAGNOSIS — Z1231 Encounter for screening mammogram for malignant neoplasm of breast: Secondary | ICD-10-CM

## 2021-03-09 LAB — LIPID PANEL
Chol/HDL Ratio: 3.3 ratio (ref 0.0–4.4)
Cholesterol, Total: 258 mg/dL — ABNORMAL HIGH (ref 100–199)
HDL: 78 mg/dL (ref >39)
LDL Chol Calc (NIH): 154 mg/dL — ABNORMAL HIGH (ref 0–99)
Triglycerides: 149 mg/dL (ref 0–149)
VLDL Cholesterol Cal: 26 mg/dL (ref 5–40)

## 2021-03-09 LAB — VITAMIN D 25 HYDROXY (VIT D DEFICIENCY, FRACTURES): Vit D, 25-Hydroxy: 45.7 ng/mL (ref 30.0–100.0)

## 2021-03-15 MED ORDER — ATOVAQUONE-PROGUANIL HCL 250-100 MG PO TABS: 1.0000 | ORAL_TABLET | Freq: Every day | ORAL | 0 refills | Status: DC

## 2021-04-18 ENCOUNTER — Ambulatory Visit
Admission: RE | Admit: 2021-04-18 | Discharge: 2021-04-18 | Disposition: A | Discharge status: Home / Self Care | Payer: PRIVATE HEALTH INSURANCE | Source: Ambulatory Visit | Attending: Obstetrics & Gynecology | Admitting: Obstetrics & Gynecology

## 2021-04-18 DIAGNOSIS — Z1231 Encounter for screening mammogram for malignant neoplasm of breast: Secondary | ICD-10-CM

## 2021-05-17 ENCOUNTER — Other Ambulatory Visit: Payer: Self-pay

## 2021-05-17 ENCOUNTER — Ambulatory Visit (INDEPENDENT_AMBULATORY_CARE_PROVIDER_SITE_OTHER): Payer: PRIVATE HEALTH INSURANCE | Admitting: Dermatology

## 2021-05-17 DIAGNOSIS — Z1283 Encounter for screening for malignant neoplasm of skin: Secondary | ICD-10-CM | POA: Diagnosis not present

## 2021-05-17 DIAGNOSIS — L851 Acquired keratosis [keratoderma] palmaris et plantaris: Secondary | ICD-10-CM | POA: Diagnosis not present

## 2021-05-17 DIAGNOSIS — L729 Follicular cyst of the skin and subcutaneous tissue, unspecified: Secondary | ICD-10-CM

## 2021-05-17 DIAGNOSIS — L738 Other specified follicular disorders: Secondary | ICD-10-CM

## 2021-05-17 DIAGNOSIS — D1801 Hemangioma of skin and subcutaneous tissue: Secondary | ICD-10-CM

## 2021-05-17 DIAGNOSIS — L578 Other skin changes due to chronic exposure to nonionizing radiation: Secondary | ICD-10-CM

## 2021-05-17 NOTE — Patient Instructions (Addendum)
Tara Hendrix in  [place]. ?

## 2021-05-29 ENCOUNTER — Encounter: Payer: Self-pay | Admitting: Dermatology

## 2021-05-30 NOTE — Progress Notes (Signed)
? ?  Follow-Up Visit ?  ?Subjective  ?Tara Hendrix is a 66 y.o. female who presents for the following: Annual Exam (Pt here for annual. Pt has a spot on the scalp and has some cosmetic questions ). ? ?General skin examination, several dermatological issues which patient wishes to discuss. ?Location:  ?Duration:  ?Quality:  ?Associated Signs/Symptoms: ?Modifying Factors:  ?Severity:  ?Timing: ?Context:  ? ?Objective  ?Well appearing patient in no apparent distress; mood and affect are within normal limits. ?General skin examination: No atypical pigmented spots (all checked with dermoscopy), no nonmelanoma skin cancer ? ?I addressed patient's questions about topical therapies and procedures for aesthetic photoaging. ? ?Right Upper Back ?Noninflamed 1 cm dermal nodule ? ?Head - Anterior (Face) ?Half dozen flesh-colored 2 mm papules, some with eccentric umbilication; compatible dermoscopy. ? ?Right Lower Leg - Posterior ?Flat tan subtly textured 4 mm papule ? ?Right Abdomen (side) - Lower ?Multiple 1 mm smooth red dermal papules ? ? ? ?A full examination was performed including scalp, head, eyes, ears, nose, lips, neck, chest, axillae, abdomen, back, buttocks, bilateral upper extremities, bilateral lower extremities, hands, feet, fingers, toes, fingernails, and toenails. All findings within normal limits unless otherwise noted below.  Areas beneath undergarments not fully examined. ? ? ?Assessment & Plan  ? ? ?Encounter for screening for malignant neoplasm of skin ? ?Annual skin examination, continued ultraviolet protection ? ?Cyst of skin ?Right Upper Back ? ?Discussed elective excision. ? ?Sebaceous gland hyperplasia ?Head - Anterior (Face) ? ?Told of similar appearance of early [place] so if any lesion grows or bleeds return for biopsy.  Also discussed ?Based therapy options that may soon be available ([place]) ? ?Stucco keratoses ?Right Lower Leg - Posterior ? ?Leave if stable ? ?Cherry angioma ?Right Abdomen (side) -  Lower ? ?No intervention necessary ? ?Photoaging of skin ? ?Referral to board-certified plastic surgeon (Dr. Darrin Luis). ? ? ? ? ? ?I, Lavonna Monarch, MD, have reviewed all documentation for this visit.  The documentation on 05/30/21 for the exam, diagnosis, procedures, and orders are all accurate and complete. ?

## 2021-06-07 ENCOUNTER — Ambulatory Visit (INDEPENDENT_AMBULATORY_CARE_PROVIDER_SITE_OTHER): Payer: PRIVATE HEALTH INSURANCE | Admitting: Nurse Practitioner

## 2021-06-07 ENCOUNTER — Encounter (HOSPITAL_BASED_OUTPATIENT_CLINIC_OR_DEPARTMENT_OTHER): Payer: Self-pay | Admitting: Nurse Practitioner

## 2021-06-07 VITALS — BP 128/88 | HR 67 | Ht 66.0 in | Wt 148.7 lb

## 2021-06-07 DIAGNOSIS — Z87891 Personal history of nicotine dependence: Secondary | ICD-10-CM | POA: Diagnosis not present

## 2021-06-07 DIAGNOSIS — R0789 Other chest pain: Secondary | ICD-10-CM

## 2021-06-07 NOTE — Patient Instructions (Addendum)
It was a pleasure seeing you today. I hope your time spent with [place] was pleasant and helpful. Please let us know if there is anything we can do to improve the service you receive.  ? ?Today we discussed concerns with: ? ?Costochondral pain ?The good news is this is self-limiting and will get better with time. I am so glad it is feeling better! ?I will send in the order for the low dose lung CT- they will call you to schedule this ? ? ? ?Important Office Information ?Lab Results ?If labs were ordered, please note that you will see results through [place] as soon as they come available from [place].  ?It takes up to 5 business days for the results to be routed to me and for me to review them once all of the lab results have come through from [facility]. I will make recommendations based on your results and send these through [place] or someone from the office will call you to discuss. If your labs are abnormal, we may contact you to schedule a visit to discuss the results and make recommendations.  ?If you have not heard from [place] within 5 business days or you have waited longer than a week and your lab results have not come through on [place], please feel free to call the office or send a message through [place] to follow-up on these labs.  ? ?Referrals ?If referrals were placed today, the office where the referral was sent will contact you either by phone or through [place] to set up scheduling. Please note that it can take up to a week for the referral office to contact you. If you do not hear from them in a week, please contact the referral office directly to inquire about scheduling.  ? ?Condition Treated ?If your condition worsens or you begin to have new symptoms, please schedule a follow-up appointment for further evaluation. If you are not sure if an appointment is needed, you may call the office to leave a message for the nurse and someone will contact you with recommendations.  ?If you have an urgent or life  threatening emergency, please do not call the office, but seek emergency evaluation by calling 911 or going to the nearest emergency room for evaluation.  ? ?MyChart and Phone Calls ?Please do not use MyChart for urgent messages. It may take up to 3 business days for MyChart messages to be read by staff and if they are unable to handle the request, an additional 3 business days for them to be routed to me and for my response.  ?Messages sent to the provider through [place] do not come directly to the provider, please allow time for these messages to be routed and for me to respond.  ?We get a large volume of MyChart messages daily and these are responded to in the order received.  ? ?For urgent messages, please call the office at [phone] and speak with the front office staff or leave a message on the line of my assistant for guidance.  ?We are seeing patients from the hours of 8:00 am through 5:00 pm and calls directly to the nurse may not be answered immediately due to seeing patients, but your call will be returned as soon as possible.  ?Phone  messages received after 4:00 PM Monday through Thursday may not be returned until the following business day. Phone messages received after 11:00 AM on Friday may not be returned until Monday.  ? ?After Hours ?We share  on call hours with providers from other offices. If you have an urgent need after hours that cannot wait until the next business day, please contact the on call provider by calling the office number. A nurse will speak with you and contact the provider if needed for recommendations.  ?If you have an urgent or life threatening emergency after hours, please do not call the on call provider, but seek emergency evaluation by calling 911 or going to the nearest emergency room for evaluation.  ? ?Paperwork ?All paperwork requires a minimum of 5 days to complete and return to you or the designated personnel. Please keep this in mind when bringing in forms or  sending requests for paperwork completion to the office.  ?  ?

## 2021-06-07 NOTE — Progress Notes (Signed)
?Orma Render, DNP, AGNP-c ?Primary Care & Sports Medicine ?[place], [place] [address] ?[phone] ? ?Subjective:  ? ?Tara Hendrix is a 66 y.o. female presents to day for pain in the rib cages that started a few weeks ago.  ?Reports she lifted herself out of the side of a pool recently and shortly thereafter noted L sided pain at the lower edge of the rib cage. She denies any bruising or swelling to the area, but notes that the area was very tender to touch. She does not recall hitting her ribs on the edge of the pool, but states this is likely to have happened without her realizing.  ?She reports the pain has slowly been improving over the last few days, but she wanted to make sure that it was OK before leaving for a trip in the near future.  ? ?She would also like to discuss lung CT for screening as she is a former smoker.  ? ?Past medical history:I have reviewed and confirmed the past medical history in the chart. ?Medications: reviewed medication list in the chart ?Allergies: reviewed allergy section in the chart ?Review of Systems: Negative for chest pain and shortness of breath, night sweats, weight loss, cough ? ?Objective:  ?BP 128/88   Pulse 67   Ht '5\' 6"'$  (1.676 m)   Wt 148 lb 11.2 oz (67.4 kg)   LMP 01/11/2014 (Exact Date)   SpO2 97%   BMI 24.00 kg/m?  ?ROS negative except for what is listed in HPI. ?Physical Exam ?Vitals and nursing note reviewed.  ?Constitutional:   ?   Appearance: Normal appearance. She is normal weight.  ?Eyes:  ?   Extraocular Movements: Extraocular movements intact.  ?   Conjunctiva/sclera: Conjunctivae normal.  ?   Pupils: Pupils are equal, round, and reactive to light.  ?Cardiovascular:  ?   Rate and Rhythm: Normal rate and regular rhythm.  ?   Pulses: Normal pulses.  ?   Heart sounds: Normal heart sounds. No murmur heard. ?  No friction rub.  ?Pulmonary:  ?   Effort: Pulmonary effort is normal. No respiratory  distress.  ?   Breath sounds: Normal breath sounds. No stridor. No wheezing, rhonchi or rales.  ?Abdominal:  ?   General: Bowel sounds are normal. There is no distension.  ?   Palpations: Abdomen is soft. There is no mass.  ?   Tenderness: There is no abdominal tenderness. There is no guarding or rebound.  ?   Hernia: No hernia is present.  ?Musculoskeletal:  ?     Arms: ? ?   Cervical back: Normal range of motion.  ?   Right lower leg: No edema.  ?   Left lower leg: No edema.  ?Lymphadenopathy:  ?   Cervical: No cervical adenopathy.  ?Skin: ?   General: Skin is warm and dry.  ?   Capillary Refill: Capillary refill takes less than 2 seconds.  ?Neurological:  ?   Mental Status: She is alert.  ?Psychiatric:     ?   Mood and Affect: Mood normal.     ?   Behavior: Behavior normal.     ?   Thought Content: Thought content normal.     ?   Judgment: Judgment normal.  ? ? ? ?Assessment & Plan:  ?   ?Tara Hendrix is a 66 y.o. female here for left sided rib pain.  ? ?Costochondral pain ?Symptoms and  presentation consistent with costochondral pain associated with strain or possible injury after lifting herself out of a pool with her upper arms. Area assessed today with no signs of injury, ecchymosis, edema, or movement to the ribs. Mild tenderness present on palpation of the lower ribs on the left side.  ?Discussed the benign nature of findings. Abdominal organs non-tender, BS normal, lungs clear.  ?Recommend continued monitoring and notify if symptoms worsen. At this time no concern for going on the trip.  ? ?Former cigarette smoker ?Order for low-dose lung CT placed for evaluation in former smoker. No alarm sx present at this time.  ? ? ?History and Medication reviewed and updated this encounter.  ? ?Himani Corona, Coralee Pesa, NP MPH ?Orma Render, DNP, AGNP-c ?06/10/2021  7:44 PM   ? ?

## 2021-06-10 DIAGNOSIS — Z87891 Personal history of nicotine dependence: Secondary | ICD-10-CM | POA: Insufficient documentation

## 2021-06-10 DIAGNOSIS — R0789 Other chest pain: Secondary | ICD-10-CM | POA: Insufficient documentation

## 2021-06-10 NOTE — Assessment & Plan Note (Signed)
Order for low-dose lung CT placed for evaluation in former smoker. No alarm sx present at this time.  ?

## 2021-06-10 NOTE — Assessment & Plan Note (Signed)
Symptoms and presentation consistent with costochondral pain associated with strain or possible injury after lifting herself out of a pool with her upper arms. Area assessed today with no signs of injury, ecchymosis, edema, or movement to the ribs. Mild tenderness present on palpation of the lower ribs on the left side.  ?Discussed the benign nature of findings. Abdominal organs non-tender, BS normal, lungs clear.  ?Recommend continued monitoring and notify if symptoms worsen. At this time no concern for going on the trip.  ?

## 2021-07-21 ENCOUNTER — Encounter (HOSPITAL_BASED_OUTPATIENT_CLINIC_OR_DEPARTMENT_OTHER): Payer: Self-pay | Admitting: Nurse Practitioner

## 2021-08-09 ENCOUNTER — Ambulatory Visit (HOSPITAL_BASED_OUTPATIENT_CLINIC_OR_DEPARTMENT_OTHER): Payer: PRIVATE HEALTH INSURANCE | Admitting: Obstetrics & Gynecology

## 2021-08-23 ENCOUNTER — Ambulatory Visit (HOSPITAL_BASED_OUTPATIENT_CLINIC_OR_DEPARTMENT_OTHER): Payer: PRIVATE HEALTH INSURANCE | Admitting: Obstetrics & Gynecology

## 2021-09-05 ENCOUNTER — Encounter (HOSPITAL_BASED_OUTPATIENT_CLINIC_OR_DEPARTMENT_OTHER): Payer: PRIVATE HEALTH INSURANCE | Admitting: Nurse Practitioner

## 2021-09-12 ENCOUNTER — Encounter (HOSPITAL_BASED_OUTPATIENT_CLINIC_OR_DEPARTMENT_OTHER): Payer: Self-pay | Admitting: Obstetrics & Gynecology

## 2021-09-12 ENCOUNTER — Ambulatory Visit (INDEPENDENT_AMBULATORY_CARE_PROVIDER_SITE_OTHER): Payer: Medicare Other | Admitting: Obstetrics & Gynecology

## 2021-09-12 VITALS — BP 180/90 | HR 69 | Ht 66.0 in | Wt 144.6 lb

## 2021-09-12 DIAGNOSIS — Z124 Encounter for screening for malignant neoplasm of cervix: Secondary | ICD-10-CM

## 2021-09-12 DIAGNOSIS — Z9189 Other specified personal risk factors, not elsewhere classified: Secondary | ICD-10-CM

## 2021-09-12 DIAGNOSIS — F5101 Primary insomnia: Secondary | ICD-10-CM | POA: Diagnosis not present

## 2021-09-12 DIAGNOSIS — R030 Elevated blood-pressure reading, without diagnosis of hypertension: Secondary | ICD-10-CM

## 2021-09-12 DIAGNOSIS — Z780 Asymptomatic menopausal state: Secondary | ICD-10-CM | POA: Diagnosis not present

## 2021-09-12 MED ORDER — ZOLPIDEM TARTRATE 5 MG PO TABS: 5.0000 mg | ORAL_TABLET | Freq: Every evening | ORAL | 1 refills | Status: DC | PRN

## 2021-09-12 NOTE — Progress Notes (Addendum)
66 y.o. G5P0010 Married White or Caucasian female here for breast and pelvic exam.  Denies vaginal bleeding.  Hx of chronic insomnia.  Uses ambien rarely.  Does need RF.  Never asks for more than what is given at yearly exams.  Discussed blood pressure with pt today.  States she checks at home and these are always normal.  Patient's last menstrual period was 01/11/2014 (exact date).          Sexually active: Yes.    H/O STD:  no  Health Maintenance: PCP:  Jacolyn Reedy.  Last wellness appt was 02/2021.  Did blood work at that appt:  yes Vaccines are up to date:  yes Colonoscopy:  2021, follow up 7 years.  Dr. Carlean Purl MMG:  03/2021 BMD:  05/17/2020, normal.  Discussed repeating in 3-4 year. Last pap smear:  07/2020.   H/o abnormal pap smear:  no   reports that she quit smoking about 3 years ago. Her smoking use included cigarettes. She has a 20.00 pack-year smoking history. She has never used smokeless tobacco. She reports current alcohol use. She reports that she does not use drugs.  Past Medical History:  Diagnosis Date   Allergy    Anemia, iron deficiency    Constipation    Gastric polyps 03/2019   hyperplastic   GERD (gastroesophageal reflux disease)    Hiatal hernia    History of chickenpox    Hx of adenomatous colonic polyps 04/07/2019   Insomnia    Internal hemorrhoids    Kidney stone    Seasonal allergies    Tubular adenoma of colon 09/13/2011   removed from the decending colon    Past Surgical History:  Procedure Laterality Date   BREAST BIOPSY Left 2020   BUNIONECTOMY Right 12/15/2011   COLONOSCOPY     COLONOSCOPY W/ BIOPSIES  09/13/2011   ESOPHAGOGASTRODUODENOSCOPY  09/13/2011   TONSILLECTOMY  1964   UPPER GASTROINTESTINAL ENDOSCOPY      Current Outpatient Medications  Medication Sig Dispense Refill   Cholecalciferol (VITAMIN D3) 50 MCG (2000 UT) TABS Take 1 tablet by mouth daily.     fluticasone (FLONASE) 50 MCG/ACT nasal spray Place into both nostrils daily.      vitamin B-12 (CYANOCOBALAMIN) 100 MCG tablet Take 100 mcg by mouth daily.     zolpidem (AMBIEN) 5 MG tablet Take 1 tablet (5 mg total) by mouth at bedtime as needed for sleep. 30 tablet 1   No current facility-administered medications for this visit.    Family History  Problem Relation Age of Onset   Heart attack Mother    Hypertension Mother    Lung cancer Mother    Hearing loss Father    Drug abuse Brother    Cancer Paternal Grandmother        unsure type   Stroke Paternal Grandfather    Intellectual disability Paternal Aunt    Breast cancer Neg Hx    Colon cancer Neg Hx    Esophageal cancer Neg Hx    Rectal cancer Neg Hx    Stomach cancer Neg Hx     Review of Systems  All other systems reviewed and are negative.   Exam:   BP (!) 180/90 (BP Location: Right Arm, Patient Position: Sitting, Cuff Size: Normal)   Pulse 69   Ht '5\' 6"'$  (1.676 m) Comment: reported  Wt 144 lb 9.6 oz (65.6 kg)   LMP 01/11/2014 (Exact Date)   BMI 23.34 kg/m   Height: '5\' 6"'$  (167.6  cm) (reported)  General appearance: alert, cooperative and appears stated age Breasts: normal appearance, no masses or tenderness Abdomen: soft, non-tender; bowel sounds normal; no masses,  no organomegaly Lymph nodes: Cervical, supraclavicular, and axillary nodes normal.  No abnormal inguinal nodes palpated Neurologic: Grossly normal  Pelvic: External genitalia:  no lesions              Urethra:  normal appearing urethra with no masses, tenderness or lesions              Bartholins and Skenes: normal                 Vagina: normal appearing vagina with atrophic changes and no discharge, no lesions              Cervix: no lesions              Pap taken: No. Bimanual Exam:  Uterus:  normal size, contour, position, consistency, mobility, non-tender              Adnexa: normal adnexa and no mass, fullness, tenderness               Rectovaginal: Confirms               Anus:  normal sphincter tone, no  lesions  Chaperone, Octaviano Batty, CMA, was present for exam.  Assessment/Plan: 1. GYN exam for high-risk Medicare patient - pap smear 07/2020.  Not indicated today. - MMG 03/2021 - colonoscopy 2021 with follow up due 7 years - BMD 04/2020 - lab work done with Clarise Cruz Early - vaccines reviewed/updated  2. Postmenopausal - not on HRT  3. Primary insomnia - RF for ambien given today - zolpidem (AMBIEN) 5 MG tablet; Take 1 tablet (5 mg total) by mouth at bedtime as needed for sleep.  Dispense: 30 tablet; Refill: 1  4. Elevated blood-pressure reading without diagnosis of hypertension - advised pt to monitor this and if sees any elevations, does need to follow up with Jacolyn Reedy, PCP.  Pt states she will.

## 2021-09-15 ENCOUNTER — Encounter (HOSPITAL_BASED_OUTPATIENT_CLINIC_OR_DEPARTMENT_OTHER): Payer: Self-pay | Admitting: Obstetrics & Gynecology

## 2021-09-15 DIAGNOSIS — R030 Elevated blood-pressure reading, without diagnosis of hypertension: Secondary | ICD-10-CM | POA: Insufficient documentation

## 2021-09-15 DIAGNOSIS — F5101 Primary insomnia: Secondary | ICD-10-CM | POA: Insufficient documentation

## 2021-09-15 DIAGNOSIS — Z780 Asymptomatic menopausal state: Secondary | ICD-10-CM | POA: Insufficient documentation

## 2021-09-22 DIAGNOSIS — H9312 Tinnitus, left ear: Secondary | ICD-10-CM | POA: Insufficient documentation

## 2021-09-22 DIAGNOSIS — H9193 Unspecified hearing loss, bilateral: Secondary | ICD-10-CM | POA: Insufficient documentation

## 2021-09-26 DIAGNOSIS — H903 Sensorineural hearing loss, bilateral: Secondary | ICD-10-CM | POA: Diagnosis not present

## 2021-09-27 ENCOUNTER — Encounter (HOSPITAL_BASED_OUTPATIENT_CLINIC_OR_DEPARTMENT_OTHER): Payer: Self-pay | Admitting: Obstetrics & Gynecology

## 2021-09-29 ENCOUNTER — Encounter: Payer: Self-pay | Admitting: Plastic Surgery

## 2021-09-29 ENCOUNTER — Ambulatory Visit: Payer: Self-pay | Admitting: Plastic Surgery

## 2021-09-29 DIAGNOSIS — Z0000 Encounter for general adult medical examination without abnormal findings: Secondary | ICD-10-CM | POA: Insufficient documentation

## 2021-09-29 DIAGNOSIS — Z719 Counseling, unspecified: Secondary | ICD-10-CM

## 2021-09-29 NOTE — Progress Notes (Signed)
Patient ID: Tara Hendrix, female    DOB: 1955/10/29, 66 y.o.   MRN: [id number]   Chief Complaint  Patient presents with   Consult         The patient is a 66 year old female here for evaluation of her face.  She is also interested in rejuvenation of her arms.  She has loss of volume in her midface with jowling and creasing of her nasolabial folds.  She does not like the crampiness of her upper arms.  She is interested in laser and filler treatment.  She also has excess skin of her upper lids.  She would do well with a blepharoplasty.  She is a little bit shy of surgery so would like to be noninvasive when able.    Review of Systems  Constitutional: Negative.   HENT: Negative.    Eyes: Negative.   Respiratory: Negative.    Cardiovascular: Negative.   Gastrointestinal: Negative.   Endocrine: Negative.   Genitourinary: Negative.   Musculoskeletal: Negative.   Skin: Negative.     Past Medical History:  Diagnosis Date   Allergy    Anemia, iron deficiency    Constipation    Gastric polyps 03/2019   hyperplastic   GERD (gastroesophageal reflux disease)    Hiatal hernia    History of chickenpox    Hx of adenomatous colonic polyps 04/07/2019   Insomnia    Internal hemorrhoids    Kidney stone    Seasonal allergies    Tubular adenoma of colon 09/13/2011   removed from the decending colon    Past Surgical History:  Procedure Laterality Date   BREAST BIOPSY Left 2020   BUNIONECTOMY Right 12/15/2011   COLONOSCOPY     COLONOSCOPY W/ BIOPSIES  09/13/2011   ESOPHAGOGASTRODUODENOSCOPY  09/13/2011   TONSILLECTOMY  1964   UPPER GASTROINTESTINAL ENDOSCOPY        Current Outpatient Medications:    Cholecalciferol (VITAMIN D3) 50 MCG (2000 UT) TABS, Take 1 tablet by mouth daily., Disp: , Rfl:    fluticasone (FLONASE) 50 MCG/ACT nasal spray, Place into both nostrils daily., Disp: , Rfl:    vitamin B-12 (CYANOCOBALAMIN) 100 MCG tablet, Take 100 mcg by mouth daily., Disp: ,  Rfl:    zolpidem (AMBIEN) 5 MG tablet, Take 1 tablet (5 mg total) by mouth at bedtime as needed for sleep., Disp: 30 tablet, Rfl: 1   Objective:   Vitals:   09/29/21 1308  BP: (!) 186/81  Pulse: 72  SpO2: 99%    Physical Exam Vitals and nursing note reviewed.  Constitutional:      Appearance: Normal appearance.  HENT:     Head: Normocephalic and atraumatic.  Cardiovascular:     Rate and Rhythm: Normal rate.     Pulses: Normal pulses.  Pulmonary:     Effort: Pulmonary effort is normal.  Abdominal:     General: Abdomen is flat.  Skin:    General: Skin is warm.     Capillary Refill: Capillary refill takes less than 2 seconds.     Coloration: Skin is not jaundiced.     Findings: No bruising.  Neurological:     Mental Status: She is alert and oriented to person, place, and time.  Psychiatric:        Mood and Affect: Mood normal.        Behavior: Behavior normal.     Assessment & Plan:  Encounter for counseling  I think the patient would do very  well with halo if she had aligned expectations with what the laser can deliver.  Midface filler would do well for her.  I think that she would look rejuvenated with an upper lid blepharoplasty as well.   I referred her to Dr. Renda Rolls and Delman Cheadle for derm. [place], DO

## 2021-10-17 ENCOUNTER — Encounter (HOSPITAL_BASED_OUTPATIENT_CLINIC_OR_DEPARTMENT_OTHER): Payer: Self-pay | Admitting: Nurse Practitioner

## 2021-10-17 ENCOUNTER — Encounter: Payer: Self-pay | Admitting: *Deleted

## 2021-10-17 ENCOUNTER — Other Ambulatory Visit (HOSPITAL_BASED_OUTPATIENT_CLINIC_OR_DEPARTMENT_OTHER): Payer: Self-pay

## 2021-10-17 ENCOUNTER — Ambulatory Visit (INDEPENDENT_AMBULATORY_CARE_PROVIDER_SITE_OTHER): Payer: Medicare Other | Admitting: Nurse Practitioner

## 2021-10-17 VITALS — BP 132/80 | HR 69 | Ht 66.0 in | Wt 140.5 lb

## 2021-10-17 DIAGNOSIS — K5900 Constipation, unspecified: Secondary | ICD-10-CM | POA: Insufficient documentation

## 2021-10-17 DIAGNOSIS — R030 Elevated blood-pressure reading, without diagnosis of hypertension: Secondary | ICD-10-CM | POA: Diagnosis not present

## 2021-10-17 DIAGNOSIS — E559 Vitamin D deficiency, unspecified: Secondary | ICD-10-CM

## 2021-10-17 DIAGNOSIS — Z0000 Encounter for general adult medical examination without abnormal findings: Secondary | ICD-10-CM | POA: Diagnosis not present

## 2021-10-17 DIAGNOSIS — Z23 Encounter for immunization: Secondary | ICD-10-CM

## 2021-10-17 DIAGNOSIS — Z7185 Encounter for immunization safety counseling: Secondary | ICD-10-CM | POA: Insufficient documentation

## 2021-10-17 DIAGNOSIS — D509 Iron deficiency anemia, unspecified: Secondary | ICD-10-CM | POA: Insufficient documentation

## 2021-10-17 NOTE — Assessment & Plan Note (Signed)
Discussion of recommendations for vaccinations for patient provided today.  At this time recommend the following vaccine schedule for protection: Pna 20 vaccine today RSV vaccine in September (if available) Flu and COVID vaccine in October (Afra Tricarico-mid) Shingles vaccine in November.  She is in agreement to the above schedule.  She will let me know if she has any concerns or problems obtaining the vaccinations.

## 2021-10-17 NOTE — Assessment & Plan Note (Signed)
Pneumococcal 20 vaccine provided today.

## 2021-10-17 NOTE — Assessment & Plan Note (Signed)
CBE today with no abnormal findings.  Discussion on labs and health maintenance provided.  Diet and exercise information provided.  We will plan to follow-up in 1 year or sooner if needed.

## 2021-10-17 NOTE — Patient Instructions (Addendum)
Flu Season is approaching We strongly encourage all eligible patients to receive the flu vaccine this fall, best time to do this is September through Tara Hendrix November to ensure you have developed immunity prior to the start of spread in our area.   The best ways to protect yourself from the flu and other viruses:  Please wash your hands regularly throughout the day with soap/water or hand sanitizer.  Be careful to limit contact with people who are sick will increase her chances of getting sick yourself. Receive the yearly flu vaccine. Eat varied and nutritious meals, get plenty of rest (8 hours a night preferred) and drink plenty of water/fluids (until your urine is clear/pale yellow).  Avoid going into public places and contact the office if you begin to experience any of the following symptoms: body aches, fever, chills, sore throat, cough, or upset stomach. If you have known close contact with someone who is positive for the flu, please contact the office for recommendations on protective measures that can be taken to help reduce the spread of the virus.   Vaccine Timing - Pneumonia today - RSV -September (if available)  - COVID and Flu- October - Shingles later in fall  Keep a record of your blood pressure over the next couple of weeks and let me know how they are looking at home. I have sent a MyChart message to come through in about 2 weeks that you can respond to directly.

## 2021-10-17 NOTE — Assessment & Plan Note (Signed)
Blood pressure reading is slightly elevated in the office today.  Her readings at the blood donation center have been primarily within normal limits.  At this time I do recommend that she monitor her blood pressure at home on a daily basis and follow-up with me in approximately 2 weeks to let me know what her blood pressures have been looking like at home.  If blood pressures are consistently staying above 120/80 we will plan to implement changes that may help improve blood pressure control.

## 2021-10-17 NOTE — Progress Notes (Signed)
BP 132/80   Pulse 69   Ht '5\' 6"'$  (1.676 m)   Wt 140 lb 8 oz (63.7 kg)   LMP 01/11/2014 (Exact Date)   SpO2 98%   BMI 22.68 kg/m    Subjective:    Patient ID: Tara Hendrix, female    DOB: 11-16-1955, 66 y.o.   MRN: [id number]  HPI: Tara Hendrix is a 66 y.o. female presenting on 10/17/2021 for comprehensive medical examination.   Current medical concerns include: Blood Pressure  She tells me that her blood pressure runs elevated when taken with automatic machines. She does give platelets twice a month and her pressures run from 130-150/80's. She tells me she does not feel stressed when she comes to the office, but she is not sure if that is the case. At the blood donation they always use the manual cuff.  She denies dizziness, headaches, CP, ShoB. She has had occasional palpitations for quite some time, but tells me these seem to be improving.  132/68, 128/68, 128/66, 136/80, 150/70, 128/82, 158/90, 136/80 a her BP readings from the last several blood donations.   Last COVID vaccine: 12/15/2020 Pfizer #5  COVID and Flu together last year in mid October She is interested in the Pna vaccine today She is interested in the RSV vaccine and has questions of the timing of this.  She also has questions about the timing of the Shingles vaccine.    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  She is currently sexually active and denies concerns today about STI  She denies concerns about skin  changes today  She denies concerns about bowel changes today  She denies concerns about bladder changes today   Most Recent Depression Screen:     10/17/2021    8:20 AM 09/12/2021    1:45 PM 06/07/2021    2:39 PM 08/31/2020    8:37 AM 08/09/2020    8:14 AM  Depression screen PHQ 2/9  Decreased Interest 0 0 0 0 0  Down, Depressed, Hopeless 0 0 0 0 0  PHQ - 2 Score 0 0 0 0 0  Altered sleeping 3   2   Tired, decreased energy 0   1   Change in appetite 0   0   Feeling bad or failure about yourself  0   0   Trouble concentrating 0   0   Moving slowly or fidgety/restless 1   0   Suicidal thoughts 0   0   PHQ-9 Score 4   3   Difficult doing work/chores Not difficult at all   Not difficult at all    Most Recent Anxiety Screen:     08/31/2020    8:37 AM  GAD 7 : Generalized Anxiety Score  Nervous, Anxious, on Edge 1  Control/stop worrying 1  Worry too much - different things 1  Trouble relaxing 0  Restless 0  Easily annoyed or irritable 1  Afraid -  awful might happen 1  Total GAD 7 Score 5  Anxiety Difficulty Not difficult at all   Most Recent Fall Screen:    10/17/2021    8:20 AM 06/07/2021    2:39 PM 08/31/2020    8:37 AM 12/27/2018   10:45 AM 05/01/2018    9:04 AM  [place] in the past year? 0 0 0 0 0  [place] falls in past yr: 0 0 0  0  Injury with Fall? 0 0 0  0  Risk for fall due to : No Fall Risks No Fall Risks No Fall Risks    Follow up Education provided;Falls evaluation completed Falls evaluation completed;Education provided Falls evaluation completed  Falls evaluation completed    All ROS negative except what is listed above and in the HPI.   Past medical history, surgical history, medications, allergies, family history and social history reviewed with patient today and changes made to appropriate areas of the chart.  Past Medical History:  Past Medical History:  Diagnosis Date   Allergy    Anemia, iron deficiency    Constipation    Gastric polyps 03/2019    hyperplastic   GERD (gastroesophageal reflux disease)    Hiatal hernia    History of chickenpox    Hx of adenomatous colonic polyps 04/07/2019   Insomnia    Internal hemorrhoids    Kidney stone    Seasonal allergies    Tubular adenoma of colon 09/13/2011   removed from the decending colon   Medications:  Current Outpatient Medications on File Prior to Visit  Medication Sig   Cholecalciferol (VITAMIN D3) 50 MCG (2000 UT) TABS Take 1 tablet by mouth daily.   fluticasone (FLONASE) 50 MCG/ACT nasal spray Place into both nostrils daily.   vitamin B-12 (CYANOCOBALAMIN) 100 MCG tablet Take 100 mcg by mouth daily.   zolpidem (AMBIEN) 5 MG tablet Take 1 tablet (5 mg total) by mouth at bedtime as needed for sleep.   No current facility-administered medications on file prior to visit.   Surgical History:  Past Surgical History:  Procedure Laterality Date   BREAST BIOPSY Left 2020   BUNIONECTOMY Right 12/15/2011   COLONOSCOPY     COLONOSCOPY W/ BIOPSIES  09/13/2011   ESOPHAGOGASTRODUODENOSCOPY  09/13/2011   TONSILLECTOMY  1964   UPPER GASTROINTESTINAL ENDOSCOPY     Allergies:  No Known Allergies Social History:  Social History   Socioeconomic History   Marital status: Married    Spouse name: Not on file   Number of children: 0   Years of education: Not on file   Highest education level: Not on file  Occupational History   Occupation: Sales  Tobacco Use   Smoking status: Former    Packs/day: 1.00    Years: 20.00    Total pack years: 20.00    Types: Cigarettes    Quit date: 08/28/2018    Years since quitting: 3.1   Smokeless tobacco: Never  Vaping Use   Vaping Use: Former  Substance and Sexual Activity   Alcohol use: Yes    Comment: 4 per day   Drug use: No   Sexual activity: Yes    Partners: Male    Birth control/protection: Surgical, Post-menopausal    Comment: vasectomy  Other Topics Concern   Not on file  Social History Narrative   She is married without  children   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   3 alcoholic beverages a day   3 caffeinated beverages a day   No drug or tobacco use former smoker   Social Determinants of Radio broadcast assistant Strain: Not on file  Food Insecurity: Not on file  Transportation Needs: Not on file  Physical Activity: Not on file  Stress: Not on file  Social Connections: Not on file  Intimate Partner Violence: Not on file   Social History   Tobacco Use  Smoking Status Former   Packs/day: 1.00   Years: 20.00   Total pack years: 20.00   Types: Cigarettes   Quit date: 08/28/2018   Years since quitting: 3.1  Smokeless Tobacco Never   Social History   Substance and Sexual Activity  Alcohol Use Yes   Comment: 4 per day   Family History:  Family History  Problem Relation Age of Onset   Heart attack Mother    Hypertension Mother    Lung cancer Mother    Hearing loss Father    Drug abuse Brother    Cancer Paternal Grandmother        unsure type   Stroke Paternal Grandfather    Intellectual disability Paternal Aunt    Breast cancer Neg Hx    Colon cancer Neg Hx    Esophageal cancer Neg Hx    Rectal cancer Neg Hx    Stomach cancer Neg Hx        Objective:    BP 132/80   Pulse 69   Ht '5\' 6"'$  (1.676 m)   Wt 140 lb 8 oz (63.7 kg)   LMP 01/11/2014 (Exact Date)   SpO2 98%   BMI 22.68 kg/m   Wt Readings from Last 3 Encounters:  10/17/21 140 lb 8 oz (63.7 kg)  09/29/21 144 lb (65.3 kg)  09/12/21 144 lb 9.6 oz (65.6 kg)    Physical Exam Vitals and nursing note reviewed.  Constitutional:      General: She is not in acute distress.    Appearance: Normal appearance.  HENT:     Head: Normocephalic and atraumatic.     Right Ear: Hearing, tympanic membrane, ear canal and external ear normal.     Left Ear: Hearing, tympanic membrane, ear canal and external ear normal.     Nose: Nose normal.     Right Sinus: No  maxillary sinus tenderness or frontal sinus tenderness.     Left Sinus: No maxillary sinus tenderness or frontal sinus tenderness.     Mouth/Throat:     Lips: Pink.     Mouth: Mucous membranes are moist.     Pharynx: Oropharynx is clear.  Eyes:     General: Lids are normal. Vision grossly intact.     Extraocular Movements: Extraocular movements intact.     Conjunctiva/sclera: Conjunctivae normal.     Pupils: Pupils are equal, round, and reactive to light.     Funduscopic exam:    Right eye: Red reflex present.        Left eye: Red reflex present.    Visual Fields: Right eye visual fields normal and left eye visual fields normal.  Neck:     Thyroid: No thyromegaly.     Vascular: No carotid bruit.  Cardiovascular:     Rate and Rhythm: Normal rate and regular rhythm.     Chest Wall: PMI is not displaced.     Pulses: Normal pulses.          Dorsalis pedis pulses are 2+  on the right side and 2+ on the left side.       Posterior tibial pulses are 2+ on the right side and 2+ on the left side.     Heart sounds: Normal heart sounds. No murmur heard. Pulmonary:     Effort: Pulmonary effort is normal. No respiratory distress.     Breath sounds: Normal breath sounds.  Abdominal:     General: Abdomen is flat. Bowel sounds are normal. There is no distension.     Palpations: Abdomen is soft. There is no hepatomegaly, splenomegaly or mass.     Tenderness: There is no abdominal tenderness. There is no right CVA tenderness, left CVA tenderness, guarding or rebound.  Musculoskeletal:        General: Normal range of motion.     Cervical back: Full passive range of motion without pain, normal range of motion and neck supple. No tenderness.     Right lower leg: No edema.     Left lower leg: No edema.  Feet:     Left foot:     Toenail Condition: Left toenails are normal.  Lymphadenopathy:     Cervical: No cervical adenopathy.     Upper Body:     Right upper body: No supraclavicular adenopathy.      Left upper body: No supraclavicular adenopathy.  Skin:    General: Skin is warm and dry.     Capillary Refill: Capillary refill takes less than 2 seconds.     Nails: There is no clubbing.  Neurological:     General: No focal deficit present.     Mental Status: She is alert and oriented to person, place, and time.     GCS: GCS eye subscore is 4. GCS verbal subscore is 5. GCS motor subscore is 6.     Sensory: Sensation is intact.     Motor: Motor function is intact.     Coordination: Coordination is intact.     Gait: Gait is intact.     Deep Tendon Reflexes: Reflexes are normal and symmetric.  Psychiatric:        Attention and Perception: Attention normal.        Mood and Affect: Mood normal.        Speech: Speech normal.        Behavior: Behavior normal. Behavior is cooperative.        Thought Content: Thought content normal.        Cognition and Memory: Cognition and memory normal.        Judgment: Judgment normal.     Results for orders placed or performed in visit on 09/23/20  Lipid panel  Result Value Ref Range   Cholesterol, Total 258 (H) 100 - 199 mg/dL   Triglycerides 149 0 - 149 mg/dL   HDL 78 >39 mg/dL   VLDL Cholesterol Cal 26 5 - 40 mg/dL   LDL Chol Calc (NIH) 154 (H) 0 - 99 mg/dL   Chol/HDL Ratio 3.3 0.0 - 4.4 ratio  Hepatic function panel  Result Value Ref Range   Total Protein 6.7 6.0 - 8.5 g/dL   Albumin 4.6 3.8 - 4.8 g/dL   Bilirubin Total 0.5 0.0 - 1.2 mg/dL   Bilirubin, Direct 0.11 0.00 - 0.40 mg/dL   Alkaline Phosphatase 81 44 - 121 IU/L   AST 23 0 - 40 IU/L   ALT 27 0 - 32 IU/L  VITAMIN D 25 Hydroxy (Vit-D Deficiency, Fractures)  Result Value Ref Range  Vit D, 25-Hydroxy 45.7 30.0 - 100.0 ng/mL  ANA  Result Value Ref Range   ANA Titer 1 Negative    Please Note: Comment   Rheumatoid Factor  Result Value Ref Range   Rhuematoid fact SerPl-aCnc <10.0 <14.0 IU/mL  VITAMIN D 25 Hydroxy (Vit-D Deficiency, Fractures)  Result Value Ref Range   Vit  D, 25-Hydroxy 48.8 30.0 - 100.0 ng/mL  Lipid panel  Result Value Ref Range   Cholesterol, Total 274 (H) 100 - 199 mg/dL   Triglycerides 136 0 - 149 mg/dL   HDL 61 >39 mg/dL   VLDL Cholesterol Cal 25 5 - 40 mg/dL   LDL Chol Calc (NIH) 188 (H) 0 - 99 mg/dL   Chol/HDL Ratio 4.5 (H) 0.0 - 4.4 ratio      Assessment & Plan:   Problem List Items Addressed This Visit     Elevated blood-pressure reading without diagnosis of hypertension    Blood pressure reading is slightly elevated in the office today.  Her readings at the blood donation center have been primarily within normal limits.  At this time I do recommend that she monitor her blood pressure at home on a daily basis and follow-up with me in approximately 2 weeks to let me know what her blood pressures have been looking like at home.  If blood pressures are consistently staying above 120/80 we will plan to implement changes that may help improve blood pressure control.      Encounter for annual physical exam - Primary    CBE today with no abnormal findings.  Discussion on labs and health maintenance provided.  Diet and exercise information provided.  We will plan to follow-up in 1 year or sooner if needed.      Need for prophylactic vaccination with Streptococcus pneumoniae (Pneumococcus) and Influenza vaccines    Pneumococcal 20 vaccine provided today.      Vaccine counseling    Discussion of recommendations for vaccinations for patient provided today.  At this time recommend the following vaccine schedule for protection: Pna 20 vaccine today RSV vaccine in September (if available) Flu and COVID vaccine in October (Davyd Podgorski-mid) Shingles vaccine in November.  She is in agreement to the above schedule.  She will let me know if she has any concerns or problems obtaining the vaccinations.      Other Visit Diagnoses     Health care maintenance       Relevant Orders   Hemoglobin A1c   VITAMIN D 25 Hydroxy (Vit-D Deficiency,  Fractures)   TSH   Lipid panel   Comprehensive metabolic panel   CBC with Differential/Platelet   B12 and Folate Panel   Vitamin D deficiency             IMMUNIZATIONS:   - Tdap: Tetanus vaccination status reviewed: last tetanus booster within 10 years. - Influenza:  Planning to receive in October - Pneumovax: Administered today - Prevnar: Not applicable - HPV: Not applicable - Zostavax vaccine:  Planning to receive in November  SCREENING: - Pap smear: up to date - STI testing: deferred -Mammogram: Up to date  - Colonoscopy: Up to date  - Bone Density: Up to date  -Hearing Test: Not applicable  -Spirometry: Not applicable   Follow up plan: Return in about 1 year (around 10/18/2022) for CPE.  NEXT PREVENTATIVE PHYSICAL DUE IN 1 YEAR.  PATIENT COUNSELING PROVIDED:   For all adult patients, I recommend   A well balanced diet low in saturated fats,  cholesterol, and moderation in carbohydrates.   This can be as simple as monitoring portion sizes and cutting back on sugary beverages such as soda and juice to start with.    Daily water consumption of at least 64 ounces.  Physical activity at least 180 minutes per week, if just starting out.   This can be as simple as taking the stairs instead of the elevator and walking 2-3 laps around the office  purposefully every day.   STD protection, partner selection, and regular testing if high risk.  Limited consumption of alcoholic beverages if alcohol is consumed.  For women, I recommend no more than 7 alcoholic beverages per week, spread out throughout the week.  Avoid "binge" drinking or consuming large quantities of alcohol in one setting.   Please let me know if you feel you may need help with reduction or quitting alcohol consumption.   Avoidance of nicotine, if used.  Please let me know if you feel you may need help with reduction or quitting nicotine use.   Daily mental health attention.  This can be in the form of 5  minute daily meditation, prayer, journaling, yoga, reflection, etc.   Purposeful attention to your emotions and mental state can significantly improve your overall wellbeing and Health.  Please know that I am here to help you with all of your health care goals and am happy to work with you to find a solution that works best for you.  The greatest advice I have received with any changes in life are to take it one step at a time, that even means if all you can focus on is the next 60 seconds, then do that and celebrate your victories.  With any changes in life, you will have set backs, and that is OK. The important thing to remember is, if you have a set back, it is not a failure, it is an opportunity to try again!  Health Maintenance Recommendations Screening Testing Mammogram Every 1 -2 years based on history and risk factors Starting at age 66 Pap Smear Ages 21-39 every 3 years Ages 42-65 every 5 years with HPV testing More frequent testing may be required based on results and history Colon Cancer Screening Every 1-10 years based on test performed, risk factors, and history Starting at age 9 Bone Density Screening Every 2-10 years based on history Starting at age 42 for women Recommendations for men differ based on medication usage, history, and risk factors AAA Screening One time ultrasound Men 56-56 years old who have every smoked Lung Cancer Screening Low Dose Lung CT every 12 months Age 52-80 years with a 30 pack-year smoking history who still smoke or who have quit within the last 15 years  Screening Labs Routine  Labs: Complete Blood Count (CBC), Complete Metabolic Panel (CMP), Cholesterol (Lipid Panel) Every 6-12 months based on history and medications May be recommended more frequently based on current conditions or previous results Hemoglobin A1c Lab Every 3-12 months based on history and previous results Starting at age 18 or earlier with diagnosis of diabetes, high  cholesterol, BMI >26, and/or risk factors Frequent monitoring for patients with diabetes to ensure blood sugar control Thyroid Panel (TSH w/ T3 & T4) Every 6 months based on history, symptoms, and risk factors May be repeated more often if on medication HIV One time testing for all patients 3 and older May be repeated more frequently for patients with increased risk factors or exposure Hepatitis C One time testing  for all patients 30 and older May be repeated more frequently for patients with increased risk factors or exposure Gonorrhea, Chlamydia Every 12 months for all sexually active persons 13-24 years Additional monitoring may be recommended for those who are considered high risk or who have symptoms PSA Men 57-20 years old with risk factors Additional screening may be recommended from age 83-69 based on risk factors, symptoms, and history  Vaccine Recommendations Tetanus Booster All adults every 10 years Flu Vaccine All patients 6 months and older every year COVID Vaccine All patients 12 years and older Initial dosing with booster May recommend additional booster based on age and health history HPV Vaccine 2 doses all patients age 90-26 Dosing may be considered for patients over 26 Shingles Vaccine (Shingrix) 2 doses all adults 73 years and older Pneumonia (Pneumovax 23) All adults 59 years and older May recommend earlier dosing based on health history Pneumonia (Prevnar 53) All adults 14 years and older Dosed 1 year after Pneumovax 23  Additional Screening, Testing, and Vaccinations may be recommended on an individualized basis based on family history, health history, risk factors, and/or exposure.

## 2021-10-18 LAB — LIPID PANEL
Chol/HDL Ratio: 3.3 ratio (ref 0.0–4.4)
Cholesterol, Total: 274 mg/dL — ABNORMAL HIGH (ref 100–199)
HDL: 83 mg/dL (ref >39)
LDL Chol Calc (NIH): 167 mg/dL — ABNORMAL HIGH (ref 0–99)
Triglycerides: 135 mg/dL (ref 0–149)
VLDL Cholesterol Cal: 24 mg/dL (ref 5–40)

## 2021-10-18 LAB — CBC WITH DIFFERENTIAL/PLATELET
Basophils Absolute: 0 10*3/uL (ref 0.0–0.2)
Basos: 1 %
EOS (ABSOLUTE): 0.2 10*3/uL (ref 0.0–0.4)
Eos: 3 %
Hematocrit: 42.9 % (ref 34.0–46.6)
Hemoglobin: 15 g/dL (ref 11.1–15.9)
Immature Grans (Abs): 0 10*3/uL (ref 0.0–0.1)
Immature Granulocytes: 0 %
Lymphocytes Absolute: 1.5 10*3/uL (ref 0.7–3.1)
Lymphs: 25 %
MCH: 33.6 pg — ABNORMAL HIGH (ref 26.6–33.0)
MCHC: 35 g/dL (ref 31.5–35.7)
MCV: 96 fL (ref 79–97)
Monocytes Absolute: 0.5 10*3/uL (ref 0.1–0.9)
Monocytes: 8 %
Neutrophils Absolute: 3.7 10*3/uL (ref 1.4–7.0)
Neutrophils: 63 %
Platelets: 228 10*3/uL (ref 150–450)
RBC: 4.46 x10E6/uL (ref 3.77–5.28)
RDW: 12.2 % (ref 11.7–15.4)
WBC: 5.9 10*3/uL (ref 3.4–10.8)

## 2021-10-18 LAB — VITAMIN D 25 HYDROXY (VIT D DEFICIENCY, FRACTURES): Vit D, 25-Hydroxy: 43.8 ng/mL (ref 30.0–100.0)

## 2021-10-18 LAB — COMPREHENSIVE METABOLIC PANEL
ALT: 17 IU/L (ref 0–32)
AST: 16 IU/L (ref 0–40)
Albumin/Globulin Ratio: 2.2 (ref 1.2–2.2)
Albumin: 4.7 g/dL (ref 3.9–4.9)
Alkaline Phosphatase: 79 IU/L (ref 44–121)
BUN/Creatinine Ratio: 13 (ref 12–28)
BUN: 10 mg/dL (ref 8–27)
Bilirubin Total: 0.6 mg/dL (ref 0.0–1.2)
CO2: 20 mmol/L (ref 20–29)
Calcium: 9.5 mg/dL (ref 8.7–10.3)
Chloride: 101 mmol/L (ref 96–106)
Creatinine, Ser: 0.8 mg/dL (ref 0.57–1.00)
Globulin, Total: 2.1 g/dL (ref 1.5–4.5)
Glucose: 127 mg/dL — ABNORMAL HIGH (ref 70–99)
Potassium: 4 mmol/L (ref 3.5–5.2)
Sodium: 139 mmol/L (ref 134–144)
Total Protein: 6.8 g/dL (ref 6.0–8.5)
eGFR: 82 mL/min/{1.73_m2} (ref >59)

## 2021-10-18 LAB — B12 AND FOLATE PANEL
Folate: 12.6 ng/mL (ref >3.0)
Vitamin B-12: 1039 pg/mL (ref 232–1245)

## 2021-10-18 LAB — HEMOGLOBIN A1C
Est. average glucose Bld gHb Est-mCnc: 114 mg/dL
Hgb A1c MFr Bld: 5.6 % (ref 4.8–5.6)

## 2021-10-18 LAB — TSH: TSH: 1.49 u[IU]/mL (ref 0.450–4.500)

## 2021-10-21 ENCOUNTER — Encounter (HOSPITAL_BASED_OUTPATIENT_CLINIC_OR_DEPARTMENT_OTHER): Payer: PRIVATE HEALTH INSURANCE | Admitting: Nurse Practitioner

## 2021-10-24 ENCOUNTER — Ambulatory Visit
Admission: RE | Admit: 2021-10-24 | Discharge: 2021-10-24 | Disposition: A | Discharge status: Home / Self Care | Payer: Medicare Other | Source: Ambulatory Visit | Attending: Nurse Practitioner | Admitting: Nurse Practitioner

## 2021-10-24 DIAGNOSIS — Z87891 Personal history of nicotine dependence: Secondary | ICD-10-CM | POA: Diagnosis not present

## 2021-10-29 ENCOUNTER — Other Ambulatory Visit (HOSPITAL_BASED_OUTPATIENT_CLINIC_OR_DEPARTMENT_OTHER): Payer: Self-pay | Admitting: Nurse Practitioner

## 2021-10-29 DIAGNOSIS — I700 Atherosclerosis of aorta: Secondary | ICD-10-CM

## 2021-10-29 DIAGNOSIS — J439 Emphysema, unspecified: Secondary | ICD-10-CM

## 2021-10-29 DIAGNOSIS — K769 Liver disease, unspecified: Secondary | ICD-10-CM | POA: Insufficient documentation

## 2021-11-04 ENCOUNTER — Ambulatory Visit (HOSPITAL_BASED_OUTPATIENT_CLINIC_OR_DEPARTMENT_OTHER)
Admission: RE | Admit: 2021-11-04 | Discharge: 2021-11-04 | Disposition: A | Discharge status: Home / Self Care | Payer: Medicare Other | Source: Ambulatory Visit | Attending: Nurse Practitioner | Admitting: Nurse Practitioner

## 2021-11-04 ENCOUNTER — Other Ambulatory Visit: Payer: Self-pay

## 2021-11-04 ENCOUNTER — Emergency Department (HOSPITAL_BASED_OUTPATIENT_CLINIC_OR_DEPARTMENT_OTHER)
Admission: EM | Admit: 2021-11-04 | Discharge: 2021-11-04 | Disposition: A | Discharge status: Home / Self Care | Payer: Medicare Other | Attending: Emergency Medicine | Admitting: Emergency Medicine

## 2021-11-04 ENCOUNTER — Emergency Department (HOSPITAL_BASED_OUTPATIENT_CLINIC_OR_DEPARTMENT_OTHER): Payer: Medicare Other | Admitting: Radiology

## 2021-11-04 ENCOUNTER — Encounter (HOSPITAL_BASED_OUTPATIENT_CLINIC_OR_DEPARTMENT_OTHER): Payer: Self-pay | Admitting: Emergency Medicine

## 2021-11-04 DIAGNOSIS — R2231 Localized swelling, mass and lump, right upper limb: Secondary | ICD-10-CM | POA: Diagnosis present

## 2021-11-04 DIAGNOSIS — D1803 Hemangioma of intra-abdominal structures: Secondary | ICD-10-CM | POA: Insufficient documentation

## 2021-11-04 DIAGNOSIS — S60011A Contusion of right thumb without damage to nail, initial encounter: Secondary | ICD-10-CM | POA: Diagnosis not present

## 2021-11-04 DIAGNOSIS — K769 Liver disease, unspecified: Secondary | ICD-10-CM | POA: Insufficient documentation

## 2021-11-04 DIAGNOSIS — L03011 Cellulitis of right finger: Secondary | ICD-10-CM | POA: Insufficient documentation

## 2021-11-04 MED ORDER — DOXYCYCLINE HYCLATE 100 MG PO CAPS: 100.0000 mg | ORAL_CAPSULE | Freq: Two times a day (BID) | ORAL | 0 refills | Status: DC

## 2021-11-04 MED ORDER — GADOBUTROL 1 MMOL/ML IV SOLN
6.3000 mL | Freq: Once | INTRAVENOUS | Status: AC | PRN
Start: 2021-11-04 — End: 2021-11-04
  Administered 2021-11-04: 6.3 mL via INTRAVENOUS
  Filled 2021-11-04: qty 7.5

## 2021-11-04 NOTE — ED Notes (Signed)
Reviewed AVS/discharge instruction with patient. Time allotted for and all questions answered. Patient is agreeable for d/c and escorted to ed exit by staff.  

## 2021-11-04 NOTE — ED Triage Notes (Signed)
Smashed her R thumb Tuesday. Woke up today with significant bruising and swelling. She thinks she saw two bites on the thumb but not sure.

## 2021-11-04 NOTE — ED Provider Notes (Signed)
[facility] EMERGENCY DEPT Provider Note   CSN: [id number] Arrival date & time: 11/04/21  1521     History  Chief Complaint  Patient presents with   Finger Injury    Tara Hendrix is a 66 y.o. female.  HPI Patient presents with pain and swelling of right thumb.  On Tuesday hit it with a hammer.  Mild pain at that time.  No subungual hematoma.  But then was working in the yard and thinks she may have been bit by something.  Then had more swelling.  Pain with movement.  No fevers.  Does hurt to bend the thumb.   Past Medical History:  Diagnosis Date   Allergy    Anemia, iron deficiency    Constipation    Gastric polyps 03/2019   hyperplastic   GERD (gastroesophageal reflux disease)    Hiatal hernia    History of chickenpox    Hx of adenomatous colonic polyps 04/07/2019   Insomnia    Internal hemorrhoids    Kidney stone    Seasonal allergies    Tubular adenoma of colon 09/13/2011   removed from the decending colon    Home Medications Prior to Admission medications   Medication Sig Start Date End Date Taking? Authorizing Provider  Cholecalciferol (VITAMIN D3) 50 MCG (2000 UT) TABS Take 1 tablet by mouth daily.    [provider]  doxycycline (VIBRAMYCIN) 100 MG capsule Take 1 capsule (100 mg total) by mouth 2 (two) times daily. 11/04/21   Davonna Belling, MD  fluticasone (FLONASE) 50 MCG/ACT nasal spray Place into both nostrils daily.    [provider]  vitamin B-12 (CYANOCOBALAMIN) 100 MCG tablet Take 100 mcg by mouth daily.    [provider]  zolpidem (AMBIEN) 5 MG tablet Take 1 tablet (5 mg total) by mouth at bedtime as needed for sleep. 09/12/21   Megan Salon, MD      Allergies    Patient has no known allergies.    Review of Systems   Review of Systems  Physical Exam Updated Vital Signs BP (!) 156/87 (BP Location: Left Arm)   Pulse 85   Temp 98.4 F (36.9 C) (Oral)   Resp 16   Ht '5\' 6"'$  (1.676 m)   Wt 63.5  kg   LMP 01/11/2014 (Exact Date)   SpO2 98%   BMI 22.60 kg/m  Physical Exam Vitals and nursing note reviewed.  Cardiovascular:     Rate and Rhythm: Regular rhythm.  Musculoskeletal:     Cervical back: Neck supple.     Comments: Pain and swelling at IP joint on right thumb.  Fluctuant and bruised area external to nail.  Somewhat decreased movement at IP joint.  Skin:    Capillary Refill: Capillary refill takes less than 2 seconds.  Neurological:     Mental Status: She is alert.        ED Results / Procedures / Treatments   Labs (all labs ordered are listed, but only abnormal results are displayed) Labs Reviewed  AEROBIC CULTURE W GRAM STAIN (SUPERFICIAL SPECIMEN)    EKG None  Radiology DG Finger Thumb Right  Result Date: 11/04/2021 CLINICAL DATA:  Smashed right thumb 3 days ago. Woke up today with increased swelling and bruising. Question of spider bite and right thumb. EXAM: RIGHT THUMB 2+V COMPARISON:  None available FINDINGS: There is high-grade focal convexity/soft tissue swelling of the dorsal thumb centered at the proximal metaphysis of the distal phalanx. The thumb metacarpophalangeal  and interphalangeal joint spaces are maintained. The cortices are intact. No acute fracture or dislocation. Mild thumb carpometacarpal joint space narrowing and peripheral osteophytosis. IMPRESSION: 1. Focal dorsal thumb soft tissue swelling just distal to the interphalangeal joint. 2. Intact bone cortices. Electronically Signed   By: Yvonne Kendall M.D.   On: 11/04/2021 16:39    Procedures Drain paronychia  Date/Time: 11/04/2021 6:00 PM  Performed by: Davonna Belling, MD Authorized by: Davonna Belling, MD  Consent: Verbal consent obtained. Written consent not obtained. Risks and benefits: risks, benefits and alternatives were discussed Consent given by: patient Patient understanding: patient states understanding of the procedure being performed Patient consent: the patient's  understanding of the procedure matches consent given Local anesthesia used: no  Anesthesia: Local anesthesia used: no  Sedation: Patient sedated: no  Patient tolerance: patient tolerated the procedure well with no immediate complications Comments: Medial aspect a large paronychia incised with scalpel.  Had been cleaned prior.  Large amount of purulent drainage.  Does have somewhat loose nail also.  Wrapped with sterile dressing after.       Medications Ordered in ED Medications - No data to display  ED Course/ Medical Decision Making/ A&P                           Medical Decision Making Amount and/or Complexity of Data Reviewed Radiology: ordered.  Risk Prescription drug management.   Patient with right thumb injury/swelling.  After the initial injury was not much swelling.  And was working in the yard and thinks it could have been a bite or sting of some kind.  Does have what appears to be a somewhat severe swelling just proximal to the nail.  Will drain it.  We will also do antibiotics.  Paronychia versus hematoma.  Incision and drainage done and showed purulent drainage.  Goes about halfway up the nail also.  Patient informed of the fact that she likely will lose this nail.  Will give antibiotics.  Does have improved movement at the IP joint after this.  With severity of the infection and that involves half the nailbed will have follow-up with hand surgery.  Will discharge home.        Final Clinical Impression(s) / ED Diagnoses Final diagnoses:  Paronychia of right thumb    Rx / DC Orders ED Discharge Orders          Ordered    doxycycline (VIBRAMYCIN) 100 MG capsule  2 times daily,   Status:  Discontinued        11/04/21 1817    doxycycline (VIBRAMYCIN) 100 MG capsule  2 times daily        11/04/21 Johnston Ebbs, MD 11/04/21 1836

## 2021-11-07 ENCOUNTER — Encounter (INDEPENDENT_AMBULATORY_CARE_PROVIDER_SITE_OTHER): Payer: Self-pay

## 2021-11-07 ENCOUNTER — Encounter: Payer: Medicare Other | Admitting: Plastic Surgery

## 2021-11-07 DIAGNOSIS — Z719 Counseling, unspecified: Secondary | ICD-10-CM

## 2021-11-07 LAB — AEROBIC CULTURE W GRAM STAIN (SUPERFICIAL SPECIMEN)

## 2021-11-08 ENCOUNTER — Telehealth: Payer: Self-pay | Admitting: *Deleted

## 2021-11-08 NOTE — Telephone Encounter (Signed)
Post ED Visit - Positive Culture Follow-up: Unsuccessful Patient Follow-up  Culture assessed and recommendations reviewed by:  '[]'$  Elenor Quinones, Pharm.D. '[]'$  Heide Guile, Pharm.D., BCPS AQ-ID '[]'$  Parks Neptune, Pharm.D., BCPS '[]'$  Alycia Rossetti, Pharm.D., BCPS '[]'$  [place], Pharm.D., BCPS, AAHIVP '[]'$  Legrand Como, Pharm.D., BCPS, AAHIVP '[]'$  Wynell Balloon, PharmD '[]'$  Vincenza Hews, PharmD, BCPS  Positive wound culture  '[]'$  Patient discharged without antimicrobial prescription and treatment is now indicated '[x]'$  Organism is resistant to prescribed ED discharge antimicrobial '[]'$  Patient with positive blood cultures  Plan:  Stop Doxycycline.  Start Levofloxacin '750mg'$  PO QD x 7 days, Merrily Pew MD  Unable to contact patient after 3 attempts, letter will be sent to address on file  Ardeen Fillers 11/08/2021, 10:37 AM

## 2021-11-08 NOTE — Progress Notes (Signed)
ED Antimicrobial Stewardship Positive Culture Follow Up   Tara Hendrix is an 66 y.o. female who presented to [facility] on 11/04/2021 with a chief complaint of  Chief Complaint  Patient presents with   Finger Injury    Recent Results (from the past 720 hour(s))  Aerobic Culture w Gram Stain (superficial specimen)     Status: None   Collection Time: 11/04/21  6:17 PM   Specimen: Thumb  Result Value Ref Range Status   Specimen Description   Final    THUMB Performed at [facility], [address] [place], [place] [address]    Special Requests   Final    NONE Performed at [facility], [address], [place], [place] [address]    Gram Stain   Final    ABUNDANT WBC PRESENT, PREDOMINANTLY PMN ABUNDANT GRAM NEGATIVE RODS ABUNDANT GRAM POSITIVE COCCI IN PAIRS Performed at [facility], [place] [address] [place], [place] [address]    Culture   Final    FEW MORGANELLA MORGANII FEW STREPTOCOCCUS CONSTELLATUS    Report Status 11/07/2021 FINAL  Final   Organism ID, Bacteria MORGANELLA MORGANII  Final   Organism ID, Bacteria STREPTOCOCCUS CONSTELLATUS  Final      Susceptibility   Streptococcus constellatus - MIC*    PENICILLIN <=0.06 SENSITIVE Sensitive     CEFTRIAXONE 0.25 SENSITIVE Sensitive     ERYTHROMYCIN <=0.12 SENSITIVE Sensitive     LEVOFLOXACIN <=0.25 SENSITIVE Sensitive     VANCOMYCIN 0.5 SENSITIVE Sensitive     * FEW STREPTOCOCCUS CONSTELLATUS   Morganella morganii - MIC*    AMPICILLIN >=32 RESISTANT Resistant     CEFAZOLIN >=64 RESISTANT Resistant     CEFTAZIDIME <=1 SENSITIVE Sensitive     CIPROFLOXACIN <=0.25 SENSITIVE Sensitive     GENTAMICIN <=1 SENSITIVE Sensitive     IMIPENEM 2 SENSITIVE Sensitive     TRIMETH/SULFA <=20 SENSITIVE Sensitive     AMPICILLIN/SULBACTAM >=32 RESISTANT Resistant     PIP/TAZO <=4 SENSITIVE Sensitive     * FEW MORGANELLA MORGANII   66 YOF presented with pain and swelling in right  thumb. Hit with hammer on 9/5. She was working in the yard and thinks she was bit by something as swelling got worse. No fevers. I&D showed purulent drainage. Diagnosed with paronychia vs hematoma. Prescribed doxycycline for 10 days. Discussed with Dr. Francia Greaves. Organisms not covered by doxycycline. Will stop doxycycline and start levofloxacin.   '[x]'$  Treated with doxycycline, organism resistant to prescribed antimicrobial '[]'$  Patient discharged originally without antimicrobial agent and treatment is now indicated  Stop doxycycline.  New antibiotic prescription: levofloxacin 750 mg PO daily x 7 days   ED Provider: Dr. Sander Radon, PharmD PGY1 Pharmacy Resident   11/08/2021  9:22 AM  Clinical Pharmacist Monday - Friday phone -  [phone] Saturday - Sunday phone - [phone]

## 2021-11-11 ENCOUNTER — Ambulatory Visit: Payer: Medicare Other | Admitting: Internal Medicine

## 2021-11-11 ENCOUNTER — Encounter: Payer: Self-pay | Admitting: Internal Medicine

## 2021-11-11 VITALS — BP 142/72 | HR 83 | Ht 66.0 in | Wt 145.0 lb

## 2021-11-11 DIAGNOSIS — K449 Diaphragmatic hernia without obstruction or gangrene: Secondary | ICD-10-CM | POA: Diagnosis not present

## 2021-11-11 DIAGNOSIS — R030 Elevated blood-pressure reading, without diagnosis of hypertension: Secondary | ICD-10-CM

## 2021-11-11 DIAGNOSIS — Z860101 Personal history of adenomatous and serrated colon polyps: Secondary | ICD-10-CM

## 2021-11-11 DIAGNOSIS — Z86010 Personal history of colonic polyps: Secondary | ICD-10-CM

## 2021-11-11 DIAGNOSIS — K219 Gastro-esophageal reflux disease without esophagitis: Secondary | ICD-10-CM | POA: Diagnosis not present

## 2021-11-11 NOTE — Patient Instructions (Addendum)
My office will contact you with next step which should be an appointment with Dr. Bryan Lemma.  He will review surgical and procedural options (laparoscopic hiatal hernia repair and transoral incisionless fundoplication - TIF)   Blood pressure is borderline high (142) - f/u PCP about this  I appreciate the opportunity to care for you. Gatha Mayer, MD, Mar[facility] Regal

## 2021-11-11 NOTE — Progress Notes (Signed)
Tara Hendrix 66 y.o. Mar 14, 1955 [id number]  Assessment & Plan:   Encounter Diagnoses  Name Primary?   Gastroesophageal reflux disease, unspecified whether esophagitis present Yes   Hiatal hernia    Elevated blood-pressure reading without diagnosis of hypertension    I think she may be a candidate for hiatal hernia surgery plus TIF.  Plan to refer to Dr. Bryan Lemma.  I think her hiatal hernia is at the limit of this combination procedure as far as size.  We will clarify with Dr. Bryan Lemma.  She probably needs repeat endoscopy plus or minus upper GI series.  She could need GERD testing such as Bravo pH probe.  TIF handout provided today.   Chart review shows some elevated blood pressures.  She says its up at doctors offices at times she is monitoring at home and coordinating with PCP.  Subjective:   Chief Complaint: Reflux, hiatal hernia treatment options discussion  Gastroenterology problem summary:  GERD-many years prior Dr. 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  She has taken PPIs throughout the years with variable success and says "all they do is mask the symptoms".  She has worked hard on improving her health and has lost weight (see below) and is eating better.  She has some heartburn still and particularly if she bends over it is a big problem so she is unable to exercise like she wants to.  No dysphagia.  She does have intermittent regurgitation.  Main issues are still having nocturnal reflux problems with pyrosis disturbing sleep.  That has been an issue for some time even despite PPI  therapy.  She says she has taken PPIs for 30 years or more.  She is interested in pursuing repair of her hiatal hernia and wondering what options there are.   Recent CT scanning showed indeterminate liver lesion on chest CT as part of screening CT scan.  This turned out to be 2 small hemangiomas by MRI.  EGD 03/2019 Tara Hendrix - Normal esophagus. - 5 cm hiatal hernia. - Two gastric polyps. Resected and retrieved.-Hyperplastic - Erythematous mucosa in the prepyloric region of the stomach. - The examination was otherwise normal. - Biopsies were taken with a cold forceps for evaluation of celiac disease.-Normal   Colonoscopy 04/19/2019 Tara Hendrix  2 adenomas, diverticulosis recall 2028   EGD 09/16/2011 Dr. Collene Mares small hiatal hernia normal-appearing esophagus gastric polyps Wt Readings from Last 3 Encounters:  11/11/21 145 lb (65.8 kg)  11/04/21 140 lb (63.5 kg)  10/17/21 140 lb 8 oz (63.7 kg)  166 pounds February 2021  No Known Allergies Current Meds  Medication Sig   Cholecalciferol (VITAMIN D3) 50 MCG (2000 UT) TABS Take 1 tablet by mouth daily.   doxycycline (VIBRAMYCIN) 100 MG capsule Take 1 capsule (100 mg total) by mouth 2 (two) times daily.   fluticasone (FLONASE) 50 MCG/ACT nasal spray Place into both nostrils daily.   vitamin B-12 (CYANOCOBALAMIN) 100 MCG tablet Take 100 mcg by mouth daily.   zolpidem (AMBIEN) 5 MG tablet Take 1 tablet (5 mg total) by mouth at bedtime as needed for sleep.   Past Medical History:  Diagnosis Date   Allergy    Anemia, iron  deficiency    Constipation    Gastric polyps 03/2019   hyperplastic   GERD (gastroesophageal reflux disease)    Hiatal hernia    History of chickenpox    Hx of adenomatous colonic polyps 04/07/2019   Insomnia    Internal hemorrhoids    Kidney stone    Seasonal allergies    Tubular adenoma of colon 09/13/2011   removed from the decending colon   Past Surgical History:  Procedure Laterality Date   BREAST BIOPSY Left 2020    BUNIONECTOMY Right 12/15/2011   COLONOSCOPY     COLONOSCOPY W/ BIOPSIES  09/13/2011   ESOPHAGOGASTRODUODENOSCOPY  09/13/2011   TONSILLECTOMY  1964   UPPER GASTROINTESTINAL ENDOSCOPY     Social History   Social History Narrative   She is married without children   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   3 alcoholic beverages a day   3 caffeinated beverages a day   No drug or tobacco use former smoker   family history includes Cancer in her paternal grandmother; Drug abuse in her brother; Hearing loss in her father; Heart attack in her mother; Hypertension in her mother; Intellectual disability in her paternal aunt; Lung cancer in her mother; Stroke in her paternal grandfather.   Review of Systems See HPI  Objective:   Physical Exam BP (!) 142/72   Pulse 83   Ht '5\' 6"'$  (1.676 m)   Wt 145 lb (65.8 kg)   LMP 01/11/2014 (Exact Date)   BMI 23.40 kg/m

## 2021-11-12 ENCOUNTER — Telehealth (HOSPITAL_BASED_OUTPATIENT_CLINIC_OR_DEPARTMENT_OTHER): Payer: Self-pay

## 2021-11-12 NOTE — Telephone Encounter (Signed)
Post ED Visit - Positive Culture Follow-up: Successful Patient Follow-Up  Culture assessed and recommendations reviewed by:  '[]'$  Elenor Quinones, Pharm.D. '[]'$  Heide Guile, Pharm.D., BCPS AQ-ID '[]'$  Parks Neptune, Pharm.D., BCPS '[]'$  Alycia Rossetti, Pharm.D., BCPS '[]'$  [place], Pharm.D., BCPS, AAHIVP '[]'$  Legrand Como, Pharm.D., BCPS, AAHIVP '[]'$  Salome Arnt, PharmD, BCPS '[]'$  Johnnette Gourd, PharmD, BCPS '[]'$  Hughes Better, PharmD, BCPS '[]'$  Leeroy Cha, PharmD  Positive wound culture  '[]'$  Patient discharged without antimicrobial prescription and treatment is now indicated '[x]'$  Organism is resistant to prescribed ED discharge antimicrobial '[]'$  Patient with positive blood cultures  Changes discussed with ED provider: Dene Gentry, MD New antibiotic prescription Levofloxacin 750 mg po daily x 7 days Called to [facility] in [place] patient, date 11/12/2021, time 3:15 pm   Glennon Hamilton 11/12/2021, 3:24 PM

## 2021-11-15 ENCOUNTER — Telehealth: Payer: Self-pay

## 2021-11-15 NOTE — Telephone Encounter (Signed)
-----   Message from [place], DO sent at 11/14/2021  9:04 AM EDT ----- Regarding: RE: Potential referral for TIF plus HH repair I think she would be a very reasonable candidate for combined lap HHR and TIF. Thanks for sending her over!  Mickel Baas, can you please schedule this patient an OV with me to discuss cTIF? We will discuss what other pre-op studies need to be done at that appointment, to include possible repeat EGD with Bravo and/or EM.   Thanks!   Vito  ----- Message ----- From: Gatha Mayer, MD Sent: 11/11/2021   6:16 PM EDT To: Lavena Bullion, DO Subject: Potential referral for TIF plus HH repair      Vito,  This lady is interested in hiatal hernia repair/GERD treatment and was asking about surgery.  She does not want to take PPI anymore.  I think she had regurgitation issues even with that.  She has never had documented erosive esophagitis at EGD, however.  Hernia is about 5 cm on my last EGD.  Would she be a candidate for combined hiatal hernia repair and TIF?  If so I will send her your way.  Thanks,  Glendell Docker

## 2021-11-15 NOTE — Telephone Encounter (Signed)
Pt is scheduled for office appt on 12/27/21 at 10:40 am with Dr. Bryan Lemma. Pt verbalized understanding and had no other concerns at end of call.

## 2021-11-18 ENCOUNTER — Telehealth: Payer: Medicare Other | Admitting: Physician Assistant

## 2021-11-18 DIAGNOSIS — L03011 Cellulitis of right finger: Secondary | ICD-10-CM

## 2021-11-18 MED ORDER — LEVOFLOXACIN 750 MG PO TABS: 750.0000 mg | ORAL_TABLET | Freq: Every day | ORAL | 0 refills | Status: DC

## 2021-11-18 NOTE — Patient Instructions (Signed)
Jarvis Morgan, thank you for joining Mar Daring, PA-C for today's virtual visit.  While this provider is not your primary care provider (PCP), if your PCP is located in our provider database this encounter information will be shared with them immediately following your visit.  Consent: (Patient) Tara Hendrix provided verbal consent for this virtual visit at the beginning of the encounter.  Current Medications:  Current Outpatient Medications:    levofloxacin (LEVAQUIN) 750 MG tablet, Take 1 tablet (750 mg total) by mouth daily., Disp: 7 tablet, Rfl: 0   Cholecalciferol (VITAMIN D3) 50 MCG (2000 UT) TABS, Take 1 tablet by mouth daily., Disp: , Rfl:    fluticasone (FLONASE) 50 MCG/ACT nasal spray, Place into both nostrils daily., Disp: , Rfl:    vitamin B-12 (CYANOCOBALAMIN) 100 MCG tablet, Take 100 mcg by mouth daily., Disp: , Rfl:    zolpidem (AMBIEN) 5 MG tablet, Take 1 tablet (5 mg total) by mouth at bedtime as needed for sleep., Disp: 30 tablet, Rfl: 1   Medications ordered in this encounter:  Meds ordered this encounter  Medications   levofloxacin (LEVAQUIN) 750 MG tablet    Sig: Take 1 tablet (750 mg total) by mouth daily.    Dispense:  7 tablet    Refill:  0    Order Specific Question:   Supervising Provider    Answer:   Chase Picket [id number]     *If you need refills on other medications prior to your next appointment, please contact your pharmacy*  Follow-Up: Call back or seek an in-person evaluation if the symptoms worsen or if the condition fails to improve as anticipated.  [place] [phone]  Other Instructions Paronychia Paronychia is an infection of the skin that surrounds a nail. It usually affects the skin around a fingernail, but it may also occur near a toenail. It often causes pain and swelling around the nail. In some cases, a collection of pus (abscess) can form near or under the nail.  This condition may develop  suddenly, or it may develop gradually over a longer period. In most cases, paronychia is not serious, and it will clear up with treatment. What are the causes? This condition may be caused by bacteria or a fungus, such as yeast. The bacteria or fungus can enter the body through an opening in the skin, such as a cut or a hangnail, and cause an infection in your fingernail or toenail. Other causes may include: Recurrent injury to the fingernail or toenail area. Irritation of the base and sides of the nail (cuticle). Injury and irritation can result in inflammation, swelling, and thickened skin around the nail. What increases the risk? This condition is more likely to develop in people who: Get their hands wet often, such as those who work as Designer, industrial/product, bartenders, or housekeepers. Bite their fingernails or cuticles. Have underlying skin conditions. Have hangnails or injured fingertips. Are exposed to irritants like detergents and other chemicals. Have diabetes. What are the signs or symptoms? Symptoms of this condition include: Redness and swelling of the skin near the nail. Tenderness around the nail when you touch the area. Pus-filled bumps under the cuticle. Fluid or pus under the nail. Throbbing pain in the area. How is this diagnosed? This condition is diagnosed with a physical exam. In some cases, a sample of pus may be tested to determine what type of bacteria or fungus is causing the condition. How is this treated? Treatment depends on  the cause and severity of your condition. If your condition is mild, it may clear up on its own in a few days or after soaking in warm water. If needed, treatment may include: Antibiotic medicine, if your infection is caused by bacteria. Antifungal medicine, if your infection is caused by a fungus. A procedure to drain pus from an abscess. Anti-inflammatory medicine (corticosteroids). Removal of part of an ingrown toenail. A bandage (dressing) may  be placed over the affected area if an abscess or part of a nail has been removed. Follow these instructions at home: Wound care Keep the affected area clean. Soak the affected area in warm water if told to do so by your health care provider. You may be told to do this for 20 minutes, 2-3 times a day. Keep the area dry when you are not soaking it. Do not try to drain an abscess yourself. Follow instructions from your health care provider about how to take care of the affected area. Make sure you: Wash your hands with soap and water for at least 20 seconds before and after you change your dressing. If soap and water are not available, use hand sanitizer. Change your dressing as told by your health care provider. If you had an abscess drained, check the area every day for signs of infection. Check for: Redness, swelling, or pain. Fluid or blood. Warmth. Pus or a bad smell. Medicines  Take over-the-counter and prescription medicines only as told by your health care provider. If you were prescribed an antibiotic medicine, take it as told by your health care provider. Do not stop taking the antibiotic even if you start to feel better. General instructions Avoid contact with any skin irritants or allergens. Do not pick at the affected area. Keep all follow-up visits as told. This is important. Prevention To prevent this condition from happening again: Wear rubber gloves when washing dishes or doing other tasks that require your hands to get wet. Wear gloves if your hands might come in contact with cleaners or other chemicals. Avoid injuring your nails or fingertips. Do not bite your nails or tear hangnails. Do not cut your nails very short. Do not cut your cuticles. Use clean nail clippers or scissors when trimming nails. Contact a health care provider if: Your symptoms get worse or do not improve with treatment. You have continued or increased fluid, blood, or pus coming from the  affected area. Your affected finger, toe, or joint becomes swollen or difficult to move. You have a fever or chills. There is redness spreading away from the affected area. Summary Paronychia is an infection of the skin that surrounds a nail. It often causes pain and swelling around the nail. In some cases, a collection of pus (abscess) can form near or under the nail. This condition may be caused by bacteria or a fungus. These germs can enter the body through an opening in the skin, such as a cut or a hangnail. If your condition is mild, it may clear up on its own in a few days. If needed, treatment may include medicine or a procedure to drain pus from an abscess. To prevent this condition from happening again, wear gloves if doing tasks that require your hands to get wet or to come in contact with chemicals. Also avoid injuring your nails or fingertips. This information is not intended to replace advice given to you by your health care provider. Make sure you discuss any questions you have with your health  care provider. Document Revised: 05/17/2020 Document Reviewed: 05/17/2020 Elsevier Patient Education  [place].    If you have been instructed to have an in-person evaluation today at a local Urgent Care facility, please use the link below. It will take you to a list of all of our available [facility], including address, phone number and hours of operation. Please do not delay care.  [facility]  If you or a family member do not have a primary care provider, use the link below to schedule a visit and establish care. When you choose a [facility] primary care physician or advanced practice provider, you gain a long-term partner in health. Find a Primary Care Provider  Learn more about 's in-office and virtual care options: [facility]

## 2021-11-18 NOTE — Progress Notes (Signed)
Virtual Visit Consent   Tara Hendrix, you are scheduled for a virtual visit with a [facility] provider today. Just as with appointments in the office, your consent must be obtained to participate. Your consent will be active for this visit and any virtual visit you may have with one of our providers in the next 365 days. If you have a MyChart account, a copy of this consent can be sent to you electronically.  As this is a virtual visit, video technology does not allow for your provider to perform a traditional examination. This may limit your provider's ability to fully assess your condition. If your provider identifies any concerns that need to be evaluated in person or the need to arrange testing (such as labs, EKG, etc.), we will make arrangements to do so. Although advances in technology are sophisticated, we cannot ensure that it will always work on either your end or our end. If the connection with a video visit is poor, the visit may have to be switched to a telephone visit. With either a video or telephone visit, we are not always able to ensure that we have a secure connection.  By engaging in this virtual visit, you consent to the provision of healthcare and authorize for your insurance to be billed (if applicable) for the services provided during this visit. Depending on your insurance coverage, you may receive a charge related to this service.  I need to obtain your verbal consent now. Are you willing to proceed with your visit today? Tara Hendrix has provided verbal consent on 11/18/2021 for a virtual visit (video or telephone). Mar Daring, PA-C  Date: 11/18/2021 3:14 PM  Virtual Visit via Video Note   I, Mar Daring, connected with  Tara Hendrix  ([id number], 04-26-1955) on 11/18/21 at  3:00 PM EDT by a video-enabled telemedicine application and verified that I am speaking with the correct person using two identifiers.  Location: Patient: Virtual  Visit Location Patient: Home Provider: Virtual Visit Location Provider: Home Office   I discussed the limitations of evaluation and management by telemedicine and the availability of in person appointments. The patient expressed understanding and agreed to proceed.    History of Present Illness: Tara Hendrix is a 66 y.o. who identifies as a female who was assigned female at birth, and is being seen today for infection of thumb following hitting it with a hammer. Was seen on 11/04/21 at [facility] [place] and had wound culture collected. Did have I&D. Was started on Doxycycline. Culture grew out Morganella Morganii and Streptococcus Constellatus. It was found to have a few resistance and Doxycycline was stopped and changed to Levofloxacin '750mg'$  daily x 7 days. She has one dose left (tonight) of Levofloxacin. She is still having redness, mild tenderness/soreness. Swelling has improved. No purulent discharge. No foul odor. Denies systemic symptoms of nausea, vomiting, fever, chills, red streaks from wound.    Problems:  Patient Active Problem List   Diagnosis Date Noted   Aortic atherosclerosis ([place]) 10/29/2021   Pulmonary emphysema ([place]) 10/29/2021   Lesion of liver greater than 1 cm in diameter 10/29/2021   Constipation 10/17/2021   Iron deficiency anemia 10/17/2021   Need for prophylactic vaccination with Streptococcus pneumoniae (Pneumococcus) and Influenza vaccines 10/17/2021   Vaccine counseling 10/17/2021   Encounter for annual physical exam 09/29/2021   Bilateral hearing loss 09/22/2021   Tinnitus of left ear 09/22/2021   Elevated blood-pressure reading without diagnosis of hypertension 09/15/2021  Primary insomnia 09/15/2021   Postmenopausal 09/15/2021   Former cigarette smoker 06/10/2021   Hx of adenomatous colonic polyps 04/07/2019   Chronic GERD 05/01/2018    Allergies: No Known Allergies Medications:  Current Outpatient Medications:    levofloxacin (LEVAQUIN) 750 MG tablet,  Take 1 tablet (750 mg total) by mouth daily., Disp: 7 tablet, Rfl: 0   Cholecalciferol (VITAMIN D3) 50 MCG (2000 UT) TABS, Take 1 tablet by mouth daily., Disp: , Rfl:    fluticasone (FLONASE) 50 MCG/ACT nasal spray, Place into both nostrils daily., Disp: , Rfl:    vitamin B-12 (CYANOCOBALAMIN) 100 MCG tablet, Take 100 mcg by mouth daily., Disp: , Rfl:    zolpidem (AMBIEN) 5 MG tablet, Take 1 tablet (5 mg total) by mouth at bedtime as needed for sleep., Disp: 30 tablet, Rfl: 1  Observations/Objective: Patient is well-developed, well-nourished in no acute distress.  Resting comfortably at home.  Head is normocephalic, atraumatic.  No labored breathing.  Speech is clear and coherent with logical content.  Patient is alert and oriented at baseline.  Right thumb nail is free from the nail bed but still attached about 2/3 up from nail bed. There is redness to the IP joint of the right thumb. Tenderness to touch is still reported. No pustule or purulent drainage.   Assessment and Plan: 1. Paronychia of right thumb - levofloxacin (LEVAQUIN) 750 MG tablet; Take 1 tablet (750 mg total) by mouth daily.  Dispense: 7 tablet; Refill: 0  - Will extend antibiotic for a few more days since not completely resolved - Epsom salt soaks  - Discussed verbally warning signs and symptoms to monitor for and be seen in person if they occur; she voiced understanding - Keep covered to prevent nail from being traumatically ripped off - Tylenol as needed for pain - Seek in person evaluation if still not improved as may benefit from repeat culture  Follow Up Instructions: I discussed the assessment and treatment plan with the patient. The patient was provided an opportunity to ask questions and all were answered. The patient agreed with the plan and demonstrated an understanding of the instructions.  A copy of instructions were sent to the patient via MyChart unless otherwise noted below.    The patient was advised  to call back or seek an in-person evaluation if the symptoms worsen or if the condition fails to improve as anticipated.  Time:  I spent 15 minutes with the patient via telehealth technology discussing the above problems/concerns.    Mar Daring, PA-C

## 2021-11-22 ENCOUNTER — Telehealth: Payer: Self-pay

## 2021-11-22 DIAGNOSIS — Z719 Counseling, unspecified: Secondary | ICD-10-CM

## 2021-11-22 NOTE — Telephone Encounter (Signed)
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  Pt. Stated she would be right in to pick up.

## 2021-11-28 DIAGNOSIS — S61111A Laceration without foreign body of right thumb with damage to nail, initial encounter: Secondary | ICD-10-CM | POA: Diagnosis not present

## 2021-12-23 ENCOUNTER — Encounter (HOSPITAL_BASED_OUTPATIENT_CLINIC_OR_DEPARTMENT_OTHER): Payer: Self-pay | Admitting: Nurse Practitioner

## 2021-12-27 ENCOUNTER — Ambulatory Visit: Payer: Medicare Other | Admitting: Gastroenterology

## 2021-12-27 ENCOUNTER — Encounter: Payer: Self-pay | Admitting: Gastroenterology

## 2021-12-27 VITALS — BP 140/80 | HR 77 | Ht 65.0 in | Wt 144.5 lb

## 2021-12-27 DIAGNOSIS — R12 Heartburn: Secondary | ICD-10-CM

## 2021-12-27 DIAGNOSIS — K219 Gastro-esophageal reflux disease without esophagitis: Secondary | ICD-10-CM

## 2021-12-27 DIAGNOSIS — K449 Diaphragmatic hernia without obstruction or gangrene: Secondary | ICD-10-CM

## 2021-12-27 NOTE — Progress Notes (Unsigned)
Chief Complaint:    GERD, hiatal hernia, discuss antireflux surgical options, heartburn   HPI:     Patient is a 66 y.o. female referred to me by Dr. Carlean Purl for evaluation of possible antireflux intervention with concomitant laparoscopic hiatal repair and transoral Incisionless Fundoplication (cTIF) with a goal to stop or significantly reduce acid suppression therapy.  Longstanding history of GERD.  Has been taking PPIs for many years. Uses rolaids for nocturnal HB. No dysphagia, nausea. Subsequently stopped taking all PPI due to combination of med ADR profile and decreasing efficacy. Of all PPIs, Dexilant was most efficacious.   Has intentionally lost weight, without change in reflux sxs. Has substernal pain and burnig nwith forward flexion-stopped exercise.    GERD history: -Index symptoms: Heartburn, rare regurgitation (worse with forward flexion), nocturnal pyrosis -Exacerbating features: Exercise/forward flexion, red wine, spicy -Medications trialed: Nexium, Prilosec, Dexilant, Protonix, Rolaids -Current medications: Rolaids -Complications: Hiatal hernia  GERD evaluation: -Last EGD: 03/2019 -Barium esophagram: -Esophageal Manometry: -pH/Impedance: -Bravo:  Endoscopic History: - EGD (08/2011, Dr. Collene Mares): Small HH, gastric polyps - EGD (03/2019, Dr. Carlean Purl): 5 cm HH, gastric hyperplastic polyps   GERD-HRQL Questionnaire Score:    Review of systems:     No chest pain, no SOB, no fevers, no urinary sx   Past Medical History:  Diagnosis Date   Allergy    Anemia, iron deficiency    Constipation    Gastric polyps 03/2019   hyperplastic   GERD (gastroesophageal reflux disease)    Hiatal hernia    History of chickenpox    Hx of adenomatous colonic polyps 04/07/2019   Insomnia    Internal hemorrhoids    Kidney stone    Seasonal allergies    Tubular adenoma of colon 09/13/2011   removed from the decending colon    Patient's surgical history, family medical history,  social history, medications and allergies were all reviewed in Epic    Current Outpatient Medications  Medication Sig Dispense Refill   Cholecalciferol (VITAMIN D3) 50 MCG (2000 UT) TABS Take 1 tablet by mouth daily.     fluticasone (FLONASE) 50 MCG/ACT nasal spray Place into both nostrils daily.     vitamin B-12 (CYANOCOBALAMIN) 100 MCG tablet Take 100 mcg by mouth daily.     zolpidem (AMBIEN) 5 MG tablet Take 1 tablet (5 mg total) by mouth at bedtime as needed for sleep. 30 tablet 1   levofloxacin (LEVAQUIN) 750 MG tablet Take 1 tablet (750 mg total) by mouth daily. (Patient not taking: Reported on 12/27/2021) 7 tablet 0   No current facility-administered medications for this visit.    Physical Exam:     BP (!) 140/80   Pulse 77   Ht '5\' 5"'$  (1.651 m)   Wt 144 lb 8 oz (65.5 kg)   LMP 01/11/2014 (Exact Date)   BMI 24.05 kg/m   GENERAL:  Pleasant *** in NAD PSYCH: : Cooperative, normal affect EENT:  conjunctiva pink, mucous membranes moist, neck supple without masses CARDIAC:  RRR, ***murmur heard, no peripheral edema PULM: Normal respiratory effort, lungs CTA bilaterally, no wheezing ABDOMEN:  Nondistended, soft, nontender. No obvious masses, no hepatomegaly,  normal bowel sounds SKIN:  turgor, no lesions seen Musculoskeletal:  Normal muscle tone, normal strength NEURO: Alert and oriented x 3, no focal neurologic deficits   IMPRESSION and PLAN:    1)   EGD with Bravo (off) [facility] Esophaggram [facility] after studies           [employer]  [place] ,DO, FACG 12/27/2021, 11:16 AM

## 2021-12-27 NOTE — Patient Instructions (Addendum)
You will be contacted by [facility] in the next 2 days to arrange Barium swallow study. The number on your caller ID will be [phone], please answer when they call.  If you have not heard from them in 2 days please call [phone] to schedule.     You have been scheduled for an endoscopy. Please follow written instructions given to you at your visit today. If you use inhalers (even only as needed), please bring them with you on the day of your procedure.   If you are age 57 or older, your body mass index should be between 23-30. Your Body mass index is 24.05 kg/m. If this is out of the aforementioned range listed, please consider follow up with your Primary Care Provider.  __________________________________________________________  The [facility] providers would like to encourage you to use [facility] to communicate with providers for non-urgent requests or questions.  Due to long hold times on the telephone, sending your provider a message by [facility] may be a faster and more efficient way to get a response.  Please allow 48 business hours for a response.  Please remember that this is for non-urgent requests.   Due to recent changes in healthcare laws, you may see the results of your imaging and laboratory studies on MyChart before your provider has had a chance to review them.  We understand that in some cases there may be results that are confusing or concerning to you. Not all laboratory results come back in the same time frame and the provider may be waiting for multiple results in order to interpret others.  Please give [place] 48 hours in order for your provider to thoroughly review all the results before contacting the office for clarification of your results.     Thank you for choosing me and [facility].  Vito Cirigliano, D.O.

## 2022-01-04 ENCOUNTER — Telehealth: Payer: Self-pay | Admitting: Gastroenterology

## 2022-01-04 NOTE — Telephone Encounter (Signed)
Inbound call from patient wanting to reschedule procedure due to her husband having cancer surgery on the same day. Please give patient a call to further advise.  Thank you

## 2022-01-04 NOTE — Telephone Encounter (Signed)
Rescheduled Bravo / EGD with patient on 03/03/21. New instruction is sent. Patient is aware.

## 2022-01-06 ENCOUNTER — Ambulatory Visit (HOSPITAL_COMMUNITY)
Admission: RE | Admit: 2022-01-06 | Discharge: 2022-01-06 | Disposition: A | Discharge status: Home / Self Care | Payer: Medicare Other | Source: Ambulatory Visit | Attending: Gastroenterology | Admitting: Gastroenterology

## 2022-01-06 DIAGNOSIS — K219 Gastro-esophageal reflux disease without esophagitis: Secondary | ICD-10-CM | POA: Insufficient documentation

## 2022-01-06 DIAGNOSIS — R12 Heartburn: Secondary | ICD-10-CM | POA: Insufficient documentation

## 2022-01-06 DIAGNOSIS — K449 Diaphragmatic hernia without obstruction or gangrene: Secondary | ICD-10-CM | POA: Diagnosis not present

## 2022-02-02 ENCOUNTER — Encounter: Payer: Medicare Other | Admitting: Gastroenterology

## 2022-03-03 ENCOUNTER — Ambulatory Visit (AMBULATORY_SURGERY_CENTER): Payer: Medicare Other | Admitting: Gastroenterology

## 2022-03-03 ENCOUNTER — Encounter: Payer: Self-pay | Admitting: Gastroenterology

## 2022-03-03 VITALS — BP 141/56 | HR 63 | Temp 98.3°F | Resp 11 | Ht 65.0 in | Wt 144.0 lb

## 2022-03-03 DIAGNOSIS — K222 Esophageal obstruction: Secondary | ICD-10-CM

## 2022-03-03 DIAGNOSIS — K219 Gastro-esophageal reflux disease without esophagitis: Secondary | ICD-10-CM

## 2022-03-03 DIAGNOSIS — K2970 Gastritis, unspecified, without bleeding: Secondary | ICD-10-CM

## 2022-03-03 DIAGNOSIS — K449 Diaphragmatic hernia without obstruction or gangrene: Secondary | ICD-10-CM

## 2022-03-03 DIAGNOSIS — K2100 Gastro-esophageal reflux disease with esophagitis, without bleeding: Secondary | ICD-10-CM | POA: Diagnosis not present

## 2022-03-03 MED ORDER — PANTOPRAZOLE SODIUM 40 MG PO TBEC: 40.0000 mg | DELAYED_RELEASE_TABLET | Freq: Two times a day (BID) | ORAL | 3 refills | Status: DC

## 2022-03-03 MED ORDER — SODIUM CHLORIDE 0.9 % IV SOLN: 500.0000 mL | Freq: Once | INTRAVENOUS | Status: DC

## 2022-03-03 NOTE — Progress Notes (Signed)
Called to room to assist during endoscopic procedure.  Patient ID and intended procedure confirmed with present staff. Received instructions for my participation in the procedure from the performing physician.  

## 2022-03-03 NOTE — Op Note (Signed)
[place] Patient Name: Tara Hendrix Procedure Date: 03/03/2022 9:35 AM MRN: [id number] Endoscopist: Gerrit Heck , MD, [id number] Age: 67 Referring MD:  Date of Birth: Jun 15, 1955 Gender: Female Account #: [id number] Procedure:                Upper GI endoscopy w/ TTS balloon dilation and                            biopsy Indications:              Heartburn, Esophageal reflux, Preoperative                            assessment Medicines:                Monitored Anesthesia Care Procedure:                Pre-Anesthesia Assessment:                           - Prior to the procedure, a History and Physical                            was performed, and patient medications and                            allergies were reviewed. The patient's tolerance of                            previous anesthesia was also reviewed. The risks                            and benefits of the procedure and the sedation                            options and risks were discussed with the patient.                            All questions were answered, and informed consent                            was obtained. Prior Anticoagulants: The patient has                            taken no anticoagulant or antiplatelet agents. ASA                            Grade Assessment: II - A patient with mild systemic                            disease. After reviewing the risks and benefits,                            the patient was deemed in satisfactory condition to  undergo the procedure.                           After obtaining informed consent, the endoscope was                            passed under direct vision. Throughout the                            procedure, the patient's blood pressure, pulse, and                            oxygen saturations were monitored continuously. The                            Endoscope was introduced through the mouth, and                             advanced to the second part of duodenum. The upper                            GI endoscopy was accomplished without difficulty.                            The patient tolerated the procedure well. Scope In: Scope Out: Findings:                 LA Grade B (one or more mucosal breaks greater than                            5 mm, not extending between the tops of two mucosal                            folds) esophagitis with no bleeding was found in                            the lower third of the esophagus. The Z line was                            atypical appearing on white light and narrow-band                            imaging. Biopsies were taken with a cold forceps                            for histology. Estimated blood loss was minimal.                           One benign-appearing, intrinsic mild stenosis was                            found 35 cm from the incisors. This stenosis  measured 1 cm (in length). The stenosis was                            traversed. The dilation site was examined and                            showed mild mucosal disruption and moderate                            improvement in luminal narrowing. Estimated blood                            loss was minimal.                           A 5 cm hiatal hernia was present.                           Mild inflammation characterized by congestion                            (edema) and erythema was found in the gastric body                            and in the gastric antrum. Biopsies were taken with                            a cold forceps for histology. Estimated blood loss                            was minimal.                           The examined duodenum was normal. Complications:            No immediate complications. Estimated Blood Loss:     Estimated blood loss was minimal. Impression:               - LA Grade B reflux esophagitis with no bleeding.                             Biopsied.                           - Benign-appearing esophageal stenosis.                           - 5 cm hiatal hernia.                           - Gastritis. Biopsied.                           - Normal examined duodenum.                           - Given the clear objective  evidence of reflux                            (erosive esophagitis and peptic stricture), the                            BRAVO was not necessary and therefore not placed                            today. Recommendation:           - Patient has a contact number available for                            emergencies. The signs and symptoms of potential                            delayed complications were discussed with the                            patient. Return to normal activities tomorrow.                            Written discharge instructions were provided to the                            patient.                           - Soft diet today, then advance as tolerated                            tomorrow per post dilation protocol.                           - Continue present medications.                           - Await pathology results.                           - Use Protonix (pantoprazole) 40 mg PO BID for 6                            weeks to promote mucosal healing, then reduce to 40                            mg daily.                           - Based on the size of the hiatal hernia and                            advanced Hill grade valve/degree of LES laxity, if  planning antireflux surgery, I recommend                            concomitant laparoscopic hiatal hernia repair with                            Transoral Incisionless Fundoplication (cTIF).                           - Will place a referral to Dr. Redmond Pulling to discuss                            concomitant repair. Gerrit Heck, MD 03/03/2022 10:12:39 AM

## 2022-03-03 NOTE — Progress Notes (Signed)
GASTROENTEROLOGY PROCEDURE H&P NOTE   Primary Care Physician: Orma Render, NP    Reason for Procedure:   GERD, hiatal hernia, pre-operative assessment, heartburn   Plan:    EGD with BRAVO placement (off PPI)   Patient is appropriate for endoscopic procedure(s) in the ambulatory ([place]) setting.  The nature of the procedure, as well as the risks, benefits, and alternatives were carefully and thoroughly reviewed with the patient. Ample time for discussion and questions allowed. The patient understood, was satisfied, and agreed to proceed.     HPI: Tara Hendrix is a 67 y.o. female who presents for EGD with BRAVO placement (off PPI) for evaluation of GERD, HH, and pre-operative assessment for HHR and fundoplication.    Past Medical History:  Diagnosis Date   Allergy    Anemia, iron deficiency    Constipation    Gastric polyps 03/2019   hyperplastic   GERD (gastroesophageal reflux disease)    Hiatal hernia    History of chickenpox    Hx of adenomatous colonic polyps 04/07/2019   Insomnia    Internal hemorrhoids    Kidney stone    Seasonal allergies    Tubular adenoma of colon 09/13/2011   removed from the decending colon    Past Surgical History:  Procedure Laterality Date   BREAST BIOPSY Left 2020   BUNIONECTOMY Right 12/15/2011   COLONOSCOPY     COLONOSCOPY W/ BIOPSIES  09/13/2011   ESOPHAGOGASTRODUODENOSCOPY  09/13/2011   TONSILLECTOMY  1964   UPPER GASTROINTESTINAL ENDOSCOPY      Prior to Admission medications   Medication Sig Start Date End Date Taking? Authorizing Provider  Cholecalciferol (VITAMIN D3) 50 MCG (2000 UT) TABS Take 1 tablet by mouth daily.   Yes [provider]  vitamin B-12 (CYANOCOBALAMIN) 100 MCG tablet Take 100 mcg by mouth daily.   Yes [provider]  fluticasone (FLONASE) 50 MCG/ACT nasal spray Place into both nostrils daily.    [provider]  zolpidem (AMBIEN) 5 MG tablet Take 1 tablet (5 mg total)  by mouth at bedtime as needed for sleep. 09/12/21   Megan Salon, MD    Current Outpatient Medications  Medication Sig Dispense Refill   Cholecalciferol (VITAMIN D3) 50 MCG (2000 UT) TABS Take 1 tablet by mouth daily.     vitamin B-12 (CYANOCOBALAMIN) 100 MCG tablet Take 100 mcg by mouth daily.     fluticasone (FLONASE) 50 MCG/ACT nasal spray Place into both nostrils daily.     zolpidem (AMBIEN) 5 MG tablet Take 1 tablet (5 mg total) by mouth at bedtime as needed for sleep. 30 tablet 1   Current Facility-Administered Medications  Medication Dose Route Frequency Provider Last Rate Last Admin   0.9 %  sodium chloride infusion  500 mL Intravenous Once Justun Anaya V, DO        Allergies as of 03/03/2022   (No Known Allergies)    Family History  Problem Relation Age of Onset   Heart attack Mother    Hypertension Mother    Lung cancer Mother    Hearing loss Father    Drug abuse Brother    Cancer Paternal Grandmother        unsure type   Stroke Paternal Grandfather    Intellectual disability Paternal Aunt    Breast cancer Neg Hx    Colon cancer Neg Hx    Esophageal cancer Neg Hx    Rectal cancer Neg Hx    Stomach cancer  Neg Hx     Social History   Socioeconomic History   Marital status: Married    Spouse name: Not on file   Number of children: 0   Years of education: Not on file   Highest education level: Not on file  Occupational History   Occupation: Sales  Tobacco Use   Smoking status: Former    Packs/day: 1.00    Years: 20.00    Total pack years: 20.00    Types: Cigarettes    Quit date: 08/28/2018    Years since quitting: 3.5   Smokeless tobacco: Never  Vaping Use   Vaping Use: Former  Substance and Sexual Activity   Alcohol use: Yes    Comment: 4 per day   Drug use: No   Sexual activity: Yes    Partners: Male    Birth control/protection: Surgical, Post-menopausal    Comment: vasectomy  Other Topics Concern   Not on file  Social History Narrative    She is married without children   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   3 alcoholic beverages a day   3 caffeinated beverages a day   No drug or tobacco use former smoker   Social Determinants of Radio broadcast assistant Strain: Not on file  Food Insecurity: Not on file  Transportation Needs: Not on file  Physical Activity: Not on file  Stress: Not on file  Social Connections: Not on file  Intimate Partner Violence: Not on file    Physical Exam: Vital signs in last 24 hours: '@BP'$  (!) 141/84   Pulse 65   Temp 98.3 F (36.8 C)   Ht '5\' 5"'$  (1.651 m)   Wt 144 lb (65.3 kg)   LMP 01/11/2014 (Exact Date)   SpO2 99%   BMI 23.96 kg/m  GEN: NAD EYE: Sclerae anicteric ENT: MMM CV: Non-tachycardic Pulm: CTA b/l GI: Soft, NT/ND NEURO:  Alert & Oriented x 3   Gerrit Heck, DO [facility]   03/03/2022 9:40 AM

## 2022-03-03 NOTE — Progress Notes (Signed)
Report to PACU, RN, vss, BBS= Clear.  

## 2022-03-03 NOTE — Patient Instructions (Addendum)
Information on esophagitis, gastritis and hiatal hernias given to you today.  Dilation diet today - see handout provided.  - Patient has a contact number available for emergencies. The signs and symptoms of potential delayed complications were discussed with the patient. Return to normal activities tomorrow. Written discharge instructions were provided to the patient. - Soft diet today, then advance as tolerated tomorrow per post dilation protocol. - Continue present medications. - Await pathology results. - Use Protonix (pantoprazole) 40 mg PO BID for 6 weeks to promote mucosal healing, then reduce to 40 mg daily. - Based on the size of the hiatal hernia and advanced Hill grade valve/degree of LES laxity, if planning antireflux surgery, I recommend concomitant laparoscopic hiatal hernia repair with Transoral Incisionless Fundoplication (cTIF). - Will place a referral to Dr. Redmond Pulling to discuss concomitant repair.     YOU HAD AN ENDOSCOPIC PROCEDURE TODAY AT [facility]:   Refer to the procedure report that was given to you for any specific questions about what was found during the examination.  If the procedure report does not answer your questions, please call your gastroenterologist to clarify.  If you requested that your care partner not be given the details of your procedure findings, then the procedure report has been included in a sealed envelope for you to review at your convenience later.  YOU SHOULD EXPECT: Some feelings of bloating in the abdomen. Passage of more gas than usual.  Walking can help get rid of the air that was put into your GI tract during the procedure and reduce the bloating. If you had a lower endoscopy (such as a colonoscopy or flexible sigmoidoscopy) you may notice spotting of blood in your stool or on the toilet paper. If you underwent a bowel prep for your procedure, you may not have a normal bowel movement for a few days.  Please Note:  You  might notice some irritation and congestion in your nose or some drainage.  This is from the oxygen used during your procedure.  There is no need for concern and it should clear up in a day or so.  SYMPTOMS TO REPORT IMMEDIATELY:  Following upper endoscopy (EGD)  Vomiting of blood or coffee ground material  New chest pain or pain under the shoulder blades  Painful or persistently difficult swallowing  New shortness of breath  Fever of 100F or higher  Black, tarry-looking stools  For urgent or emergent issues, a gastroenterologist can be reached at any hour by calling [phone]. Do not use MyChart messaging for urgent concerns.    DIET:  We do recommend a small meal at first, but then you may proceed to your regular diet.  Drink plenty of fluids but you should avoid alcoholic beverages for 24 hours.  ACTIVITY:  You should plan to take it easy for the rest of today and you should NOT DRIVE or use heavy machinery until tomorrow (because of the sedation medicines used during the test).    FOLLOW UP: Our staff will call the number listed on your records the next business day following your procedure.  We will call around 7:15- 8:00 am to check on you and address any questions or concerns that you may have regarding the information given to you following your procedure. If we do not reach you, we will leave a message.     If any biopsies were taken you will be contacted by phone or by letter within the next 1-3 weeks.  Please  call us at [phone] if you have not heard about the biopsies in 3 weeks.    SIGNATURES/CONFIDENTIALITY: You and/or your care partner have signed paperwork which will be entered into your electronic medical record.  These signatures attest to the fact that that the information above on your After Visit Summary has been reviewed and is understood.  Full responsibility of the confidentiality of this discharge information lies with you and/or your care-partner.

## 2022-03-06 ENCOUNTER — Telehealth: Payer: Self-pay

## 2022-03-06 NOTE — Telephone Encounter (Signed)
  Follow up Call-     03/03/2022    9:09 AM  Call back number  Post procedure Call Back phone  # [phone]  Permission to leave phone message Yes     Patient questions:  Do you have a fever, pain , or abdominal swelling? No. Pain Score  0 *  Have you tolerated food without any problems? Yes.    Have you been able to return to your normal activities? Yes.    Do you have any questions about your discharge instructions: Diet   No. Medications  No. Follow up visit  No.  Do you have questions or concerns about your Care? No.  Actions: * If pain score is 4 or above: No action needed, pain <4.

## 2022-03-08 ENCOUNTER — Encounter: Payer: Self-pay | Admitting: Gastroenterology

## 2022-03-14 ENCOUNTER — Telehealth: Payer: Self-pay

## 2022-03-14 NOTE — Telephone Encounter (Signed)
Referral faxed to [facility] as requested on 03/03/22 EGD report.

## 2022-03-20 ENCOUNTER — Encounter: Payer: Self-pay | Admitting: Nurse Practitioner

## 2022-03-21 ENCOUNTER — Other Ambulatory Visit: Payer: Self-pay | Admitting: Obstetrics & Gynecology

## 2022-03-21 DIAGNOSIS — Z1231 Encounter for screening mammogram for malignant neoplasm of breast: Secondary | ICD-10-CM

## 2022-04-11 NOTE — Telephone Encounter (Signed)
Inbound call from [facility] at [facility] wanting to have the referral re faxed over. Advised by the nurse that she will refax referral.

## 2022-04-11 NOTE — Telephone Encounter (Signed)
Received call from [facility] stating they did not receive fax. Referral refaxed to [facility]. Fax sent to [phone] and [phone].

## 2022-04-11 NOTE — Telephone Encounter (Signed)
Referral refaxed. See note below.

## 2022-04-27 DIAGNOSIS — K449 Diaphragmatic hernia without obstruction or gangrene: Secondary | ICD-10-CM | POA: Diagnosis not present

## 2022-04-27 DIAGNOSIS — K2100 Gastro-esophageal reflux disease with esophagitis, without bleeding: Secondary | ICD-10-CM | POA: Diagnosis not present

## 2022-05-03 DIAGNOSIS — H93293 Other abnormal auditory perceptions, bilateral: Secondary | ICD-10-CM | POA: Insufficient documentation

## 2022-05-11 ENCOUNTER — Ambulatory Visit
Admission: RE | Admit: 2022-05-11 | Discharge: 2022-05-11 | Disposition: A | Discharge status: Home / Self Care | Payer: Medicare Other | Source: Ambulatory Visit | Attending: Obstetrics & Gynecology | Admitting: Obstetrics & Gynecology

## 2022-05-11 DIAGNOSIS — Z1231 Encounter for screening mammogram for malignant neoplasm of breast: Secondary | ICD-10-CM

## 2022-05-23 ENCOUNTER — Ambulatory Visit: Payer: PRIVATE HEALTH INSURANCE | Admitting: Dermatology

## 2022-06-26 DIAGNOSIS — L578 Other skin changes due to chronic exposure to nonionizing radiation: Secondary | ICD-10-CM | POA: Diagnosis not present

## 2022-06-26 DIAGNOSIS — L814 Other melanin hyperpigmentation: Secondary | ICD-10-CM | POA: Diagnosis not present

## 2022-06-26 DIAGNOSIS — L821 Other seborrheic keratosis: Secondary | ICD-10-CM | POA: Diagnosis not present

## 2022-06-26 DIAGNOSIS — L710 Perioral dermatitis: Secondary | ICD-10-CM | POA: Diagnosis not present

## 2022-06-26 DIAGNOSIS — L723 Sebaceous cyst: Secondary | ICD-10-CM | POA: Diagnosis not present

## 2022-08-08 NOTE — Progress Notes (Signed)
This encounter was created in error - please disregard.

## 2022-08-16 ENCOUNTER — Telehealth: Payer: Self-pay

## 2022-08-16 DIAGNOSIS — K2970 Gastritis, unspecified, without bleeding: Secondary | ICD-10-CM

## 2022-08-16 MED ORDER — PANTOPRAZOLE SODIUM 40 MG PO TBEC: 40.0000 mg | DELAYED_RELEASE_TABLET | Freq: Two times a day (BID) | ORAL | 1 refills | Status: DC

## 2022-08-16 NOTE — Telephone Encounter (Signed)
Received refill request from [facility] for Pantoprazole 40MG . Called patient to confirm how she is taking her med. Stated she is managing her symptoms well taking BID. Advised patient to take once daily per Dr. Barron Alvine procedure note on 03/03/22. Patient stated she would try taking once daily and if her symptoms return, she would go back to BID. Let her know that I would send prescription for twice daily just in case if taking 1 tablet daily doesn't work. Patient voiced understanding.

## 2022-08-24 ENCOUNTER — Other Ambulatory Visit: Payer: Self-pay | Admitting: Gastroenterology

## 2022-08-24 DIAGNOSIS — K2970 Gastritis, unspecified, without bleeding: Secondary | ICD-10-CM

## 2022-09-26 ENCOUNTER — Encounter: Payer: Self-pay | Admitting: Family Medicine

## 2022-09-26 ENCOUNTER — Ambulatory Visit (INDEPENDENT_AMBULATORY_CARE_PROVIDER_SITE_OTHER): Payer: Medicare Other | Admitting: Family Medicine

## 2022-09-26 VITALS — BP 150/78 | HR 79 | Temp 98.0°F | Resp 16 | Wt 145.2 lb

## 2022-09-26 DIAGNOSIS — L237 Allergic contact dermatitis due to plants, except food: Secondary | ICD-10-CM

## 2022-09-26 MED ORDER — PREDNISONE 10 MG (21) PO TBPK: ORAL_TABLET | ORAL | 0 refills | Status: DC

## 2022-09-26 MED ORDER — METHYLPREDNISOLONE SODIUM SUCC 125 MG IJ SOLR
125.0000 mg | Freq: Once | INTRAMUSCULAR | Status: AC
  Administered 2022-09-26: 125 mg via INTRAMUSCULAR

## 2022-09-26 NOTE — Addendum Note (Signed)
Addended by: Benjiman Core on: 09/26/2022 12:05 PM   Modules accepted: Orders

## 2022-09-26 NOTE — Progress Notes (Signed)
   Subjective:    Patient ID: Tara Hendrix, female    DOB: [birth year], 67 y.o.   MRN: [id number]  HPI She did some yard work 3 days ago and developed a rash present on her arms and to a lesser extent on her right lower extremity that is pruritic and erythematous in nature.   Review of Systems     Objective:    Physical Exam Both forearms are erythematous with several lesions being streaking in nature.  Also noting a same thing on her lower extremity.       Assessment & Plan:  Allergic contact dermatitis due to plants, except food - Plan: predniSONE (STERAPRED UNI-PAK 21 TAB) 10 MG (21) TBPK tablet I will give an injection as well as use a Sterapred Dosepak.  Recommend Benadryl pills and cortisone cream as well as cool compresses.

## 2022-09-27 ENCOUNTER — Telehealth: Payer: Self-pay | Admitting: Nurse Practitioner
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
## 2022-09-27 NOTE — Telephone Encounter (Signed)
Pt advised.

## 2022-10-10 ENCOUNTER — Other Ambulatory Visit: Payer: Self-pay | Admitting: Gastroenterology

## 2022-10-10 DIAGNOSIS — K2970 Gastritis, unspecified, without bleeding: Secondary | ICD-10-CM

## 2022-10-18 ENCOUNTER — Encounter (HOSPITAL_BASED_OUTPATIENT_CLINIC_OR_DEPARTMENT_OTHER): Payer: Medicare Other | Admitting: Nurse Practitioner

## 2022-10-19 ENCOUNTER — Ambulatory Visit: Payer: Medicare Other | Admitting: Nurse Practitioner

## 2022-10-19 ENCOUNTER — Encounter: Payer: Self-pay | Admitting: Nurse Practitioner

## 2022-10-19 VITALS — BP 132/82 | HR 68 | Ht 66.0 in | Wt 144.6 lb

## 2022-10-19 DIAGNOSIS — Z23 Encounter for immunization: Secondary | ICD-10-CM | POA: Diagnosis not present

## 2022-10-19 DIAGNOSIS — K769 Liver disease, unspecified: Secondary | ICD-10-CM

## 2022-10-19 DIAGNOSIS — F418 Other specified anxiety disorders: Secondary | ICD-10-CM

## 2022-10-19 DIAGNOSIS — Z0000 Encounter for general adult medical examination without abnormal findings: Secondary | ICD-10-CM | POA: Diagnosis not present

## 2022-10-19 DIAGNOSIS — D509 Iron deficiency anemia, unspecified: Secondary | ICD-10-CM

## 2022-10-19 DIAGNOSIS — F5101 Primary insomnia: Secondary | ICD-10-CM | POA: Diagnosis not present

## 2022-10-19 DIAGNOSIS — E538 Deficiency of other specified B group vitamins: Secondary | ICD-10-CM

## 2022-10-19 DIAGNOSIS — Z87891 Personal history of nicotine dependence: Secondary | ICD-10-CM

## 2022-10-19 DIAGNOSIS — R030 Elevated blood-pressure reading, without diagnosis of hypertension: Secondary | ICD-10-CM

## 2022-10-19 DIAGNOSIS — E559 Vitamin D deficiency, unspecified: Secondary | ICD-10-CM

## 2022-10-19 DIAGNOSIS — I700 Atherosclerosis of aorta: Secondary | ICD-10-CM

## 2022-10-19 MED ORDER — ZOLPIDEM TARTRATE 5 MG PO TABS
5.0000 mg | ORAL_TABLET | Freq: Every evening | ORAL | 1 refills | Status: DC | PRN
Start: 2022-10-19 — End: 2023-03-14

## 2022-10-19 MED ORDER — ALPRAZOLAM 0.25 MG PO TABS
0.2500 mg | ORAL_TABLET | Freq: Two times a day (BID) | ORAL | 0 refills | Status: AC | PRN
Start: 2022-10-19 — End: ?

## 2022-10-19 NOTE — Assessment & Plan Note (Signed)

## 2022-10-19 NOTE — Patient Instructions (Addendum)
Keep an eye on your blood pressure at home by checking a few days a week. If it is consistently running higher than 130/80, let me know. Avoid high salt intake and be sure you are staying well dehydrated.   I have sent the order for the Lung Scan. They will contact you to set this up.   Let me know if your anxiety seems to get worse.

## 2022-10-19 NOTE — Progress Notes (Signed)
Shawna Clamp, DNP, AGNP-c [facility] [address] [place], [place] [address] Main Office [phone]  BP 132/82   Pulse 68   Ht 5\' 6"  (1.676 m)   Wt 144 lb 9.6 oz (65.6 kg)   LMP 01/11/2014 (Exact Date)   BMI 23.34 kg/m    Subjective:    Patient ID: Tara Hendrix, female    DOB: 08-21-1955, 67 y.o.   MRN: [id number]  HPI: Tara Hendrix is a 67 y.o. female presenting on 10/19/2022 for comprehensive medical examination.   Current medical concerns include: Anxiety and insomnia.  Chronic insomnia with ambien use. Needs refills.  Anxiety symptoms escalated due to caring for husband. Not daily heightened anxiety, but moments of feeling overwhelmed and panic.   Pertinent items are noted in HPI.  IMMUNIZATIONS:   Flu: Flu vaccine declined, patient will complete later She will get this with COVID in  Prevnar 13: Prevnar 13 N/A for this patient Prevnar 20: Prevnar 20 declined, patient will complete at a later date Pneumovax 23: Pneumovax completed, documentation in chart Vac Shingrix: Shingrix due, given today (Dose # 1/2) HPV: HPV N/A for this patient Tetanus: Tetanus completed in the last 10 years COVID: COVID completed, documentation in chart   HEALTH MAINTENANCE: Pap Smear HM Status: is up to date Mammogram HM Status: is up to date Colon Cancer Screening HM Status: is up to date Bone Density HM Status: is up to date STI Testing HM Status: was declined  Lung CT HM Status: ordered  She has no concerns with her vision or dentition. She has recently gotten hearing aids and reports this is going well.   Most Recent Depression Screen:     10/19/2022    8:28 AM 10/17/2021    8:20 AM 09/12/2021    1:45 PM 06/07/2021    2:39 PM 08/31/2020    8:37 AM  Depression screen PHQ 2/9  Decreased Interest 0 0 0 0 0  Down, Depressed, Hopeless 0 0 0 0 0  PHQ - 2 Score 0 0 0 0 0  Altered sleeping  3   2  Tired, decreased energy  0   1  Change in appetite   0   0  Feeling bad or failure about yourself   0   0  Trouble concentrating  0   0  Moving slowly or fidgety/restless  1   0  Suicidal thoughts  0   0  PHQ-9 Score  4   3  Difficult doing work/chores  Not difficult at all   Not difficult at all   Most Recent Anxiety Screen:     10/19/2022    8:28 AM 08/31/2020    8:37 AM  GAD 7 : Generalized Anxiety Score  Nervous, Anxious, on Edge 1 1  Control/stop worrying 1 1  Worry too much - different things 1 1  Trouble relaxing 3 0  Restless 3 0  Easily annoyed or irritable 2 1  Afraid - awful might happen 1 1  Total GAD 7 Score 12 5  Anxiety Difficulty Somewhat difficult Not difficult at all   Most Recent Fall Screen:    10/19/2022    8:28 AM 09/26/2022   11:31 AM 10/17/2021    8:20 AM 06/07/2021    2:39 PM 08/31/2020    8:37 AM  Fall Risk   Falls in the past year? 1 0 0 0 0  Number falls in past yr: 0 0 0 0 0  Injury with  Fall? 0 0 0 0 0  Risk for fall due to : No Fall Risks No Fall Risks No Fall Risks No Fall Risks No Fall Risks  Follow up Falls evaluation completed Falls evaluation completed Education provided;Falls evaluation completed Falls evaluation completed;Education provided Falls evaluation completed    Past medical history, surgical history, medications, allergies, family history and social history reviewed with patient today and changes made to appropriate areas of the chart.  Past Medical History:  Past Medical History:  Diagnosis Date   Allergy    Anemia, iron deficiency    Constipation    Gastric polyps 03/2019   hyperplastic   GERD (gastroesophageal reflux disease)    Hiatal hernia    History of chickenpox    Hx of adenomatous colonic polyps 04/07/2019   Insomnia    Internal hemorrhoids    Kidney stone    Seasonal allergies    Tubular adenoma of colon 09/13/2011   removed from the decending colon   Medications:  Current Outpatient Medications on File Prior to Visit  Medication Sig   Cholecalciferol (VITAMIN  D3) 50 MCG (2000 UT) TABS Take 1 tablet by mouth daily.   pantoprazole (PROTONIX) 40 MG tablet Take 1 tablet (40 mg total) by mouth 2 (two) times daily.   thiamine (VITAMIN B-1) 100 MG tablet Take 100 mg by mouth daily.   vitamin B-12 (CYANOCOBALAMIN) 100 MCG tablet Take 100 mcg by mouth daily.   No current facility-administered medications on file prior to visit.   Surgical History:  Past Surgical History:  Procedure Laterality Date   BREAST BIOPSY Left 2020   BUNIONECTOMY Right 12/15/2011   COLONOSCOPY     COLONOSCOPY W/ BIOPSIES  09/13/2011   ESOPHAGOGASTRODUODENOSCOPY  09/13/2011   TONSILLECTOMY  1964   UPPER GASTROINTESTINAL ENDOSCOPY     Allergies:  No Known Allergies Family History:  Family History  Problem Relation Age of Onset   Heart attack Mother    Hypertension Mother    Lung cancer Mother    Hearing loss Father    Drug abuse Brother    Cancer Paternal Grandmother        unsure type   Stroke Paternal Grandfather    Intellectual disability Paternal Aunt    Breast cancer Neg Hx    Colon cancer Neg Hx    Esophageal cancer Neg Hx    Rectal cancer Neg Hx    Stomach cancer Neg Hx        Objective:    BP 132/82   Pulse 68   Ht 5\' 6"  (1.676 m)   Wt 144 lb 9.6 oz (65.6 kg)   LMP 01/11/2014 (Exact Date)   BMI 23.34 kg/m   Wt Readings from Last 3 Encounters:  10/19/22 144 lb 9.6 oz (65.6 kg)  09/26/22 145 lb 3.2 oz (65.9 kg)  03/03/22 144 lb (65.3 kg)    Physical Exam Vitals and nursing note reviewed.  Constitutional:      Appearance: Normal appearance. She is normal weight.  HENT:     Head: Normocephalic and atraumatic.     Right Ear: Tympanic membrane normal.     Left Ear: Tympanic membrane normal.     Nose: Nose normal.     Mouth/Throat:     Mouth: Mucous membranes are moist.     Pharynx: Oropharynx is clear.  Eyes:     Conjunctiva/sclera: Conjunctivae normal.     Pupils: Pupils are equal, round, and reactive to light.  Neck:  Vascular: No  carotid bruit.  Cardiovascular:     Rate and Rhythm: Normal rate and regular rhythm.     Pulses: Normal pulses.     Heart sounds: Normal heart sounds.  Pulmonary:     Effort: Pulmonary effort is normal.     Breath sounds: Normal breath sounds.  Abdominal:     General: Abdomen is flat. Bowel sounds are normal. There is no distension.     Palpations: Abdomen is soft.     Tenderness: There is no abdominal tenderness. There is no right CVA tenderness, left CVA tenderness or guarding.  Musculoskeletal:        General: Normal range of motion.     Cervical back: Normal range of motion.     Right lower leg: No edema.     Left lower leg: No edema.  Lymphadenopathy:     Cervical: No cervical adenopathy.  Skin:    General: Skin is warm and dry.     Capillary Refill: Capillary refill takes less than 2 seconds.  Neurological:     General: No focal deficit present.     Mental Status: She is alert and oriented to person, place, and time.     Sensory: No sensory deficit.     Motor: No weakness.     Coordination: Coordination normal.  Psychiatric:        Mood and Affect: Mood normal.        Behavior: Behavior normal.     Results for orders placed or performed in visit on 10/19/22  CBC with Differential/Platelet  Result Value Ref Range   WBC 5.8 3.4 - 10.8 x10E3/uL   RBC 4.43 3.77 - 5.28 x10E6/uL   Hemoglobin 14.5 11.1 - 15.9 g/dL   Hematocrit 16.1 09.6 - 46.6 %   MCV 96 79 - 97 fL   MCH 32.7 26.6 - 33.0 pg   MCHC 34.2 31.5 - 35.7 g/dL   RDW 04.5 40.9 - 81.1 %   Platelets 251 150 - 450 x10E3/uL   Neutrophils 63 Not Estab. %   Lymphs 25 Not Estab. %   Monocytes 8 Not Estab. %   Eos 3 Not Estab. %   Basos 1 Not Estab. %   Neutrophils Absolute 3.6 1.4 - 7.0 x10E3/uL   Lymphocytes Absolute 1.5 0.7 - 3.1 x10E3/uL   Monocytes Absolute 0.5 0.1 - 0.9 x10E3/uL   EOS (ABSOLUTE) 0.2 0.0 - 0.4 x10E3/uL   Basophils Absolute 0.0 0.0 - 0.2 x10E3/uL   Immature Granulocytes 0 Not Estab. %    Immature Grans (Abs) 0.0 0.0 - 0.1 x10E3/uL  CMP14+EGFR  Result Value Ref Range   Glucose 120 (H) 70 - 99 mg/dL   BUN 10 8 - 27 mg/dL   Creatinine, Ser 9.14 0.57 - 1.00 mg/dL   eGFR 81 >78 GN/FAO/1.30   BUN/Creatinine Ratio 13 12 - 28   Sodium 139 134 - 144 mmol/L   Potassium 4.8 3.5 - 5.2 mmol/L   Chloride 102 96 - 106 mmol/L   CO2 21 20 - 29 mmol/L   Calcium 10.0 8.7 - 10.3 mg/dL   Total Protein 7.1 6.0 - 8.5 g/dL   Albumin 4.6 3.9 - 4.9 g/dL   Globulin, Total 2.5 1.5 - 4.5 g/dL   Bilirubin Total 0.7 0.0 - 1.2 mg/dL   Alkaline Phosphatase 95 44 - 121 IU/L   AST 18 0 - 40 IU/L   ALT 20 0 - 32 IU/L  Lipid panel  Result Value Ref Range  Cholesterol, Total 287 (H) 100 - 199 mg/dL   Triglycerides 284 0 - 149 mg/dL   HDL 90 >13 mg/dL   VLDL Cholesterol Cal 19 5 - 40 mg/dL   LDL Chol Calc (NIH) 244 (H) 0 - 99 mg/dL   Chol/HDL Ratio 3.2 0.0 - 4.4 ratio  Iron, TIBC and Ferritin Panel  Result Value Ref Range   Total Iron Binding Capacity 365 250 - 450 ug/dL   UIBC 010 272 - 536 ug/dL   Iron 644 (H) 27 - 034 ug/dL   Iron Saturation 52 15 - 55 %   Ferritin 181 (H) 15 - 150 ng/mL  TSH  Result Value Ref Range   TSH 1.070 0.450 - 4.500 uIU/mL  VITAMIN D 25 Hydroxy (Vit-D Deficiency, Fractures)  Result Value Ref Range   Vit D, 25-Hydroxy 46.8 30.0 - 100.0 ng/mL  Vitamin B12  Result Value Ref Range   Vitamin B-12 1,440 (H) 232 - 1,245 pg/mL         Assessment & Plan:   Problem List Items Addressed This Visit     Former cigarette smoker    Lung ca screening referral placed today. No alarm symptoms present.       Relevant Orders   Ambulatory Referral for Lung Cancer Screening [REF832]   Elevated blood-pressure reading without diagnosis of hypertension    No symptoms. Patient will monitor BP at home and report if readings are consistently >130/80.       Primary insomnia    Chronic. Stable. Well controlled on ambien. Refills provided.       Relevant Medications    zolpidem (AMBIEN) 5 MG tablet   Aortic atherosclerosis (HCC)    Chronic. Lipids monitored. Low fat diet and exercise recommended.       Relevant Orders   CBC with Differential/Platelet (Completed)   CMP14+EGFR (Completed)   Lipid panel (Completed)   Iron, TIBC and Ferritin Panel (Completed)   Situational anxiety    Recent worsening of anxiety symptoms with panic like episodes. Patient is under increased amount of stress with responsibilities. Will start PRN treatment with xanax 0.25mg  PRN. Patient aware if this is needed daily to contact the office for appt to discuss alternative options due to dependence risk and increased risks over age 57 with chronic use       Relevant Medications   ALPRAZolam (XANAX) 0.25 MG tablet   Other Relevant Orders   TSH (Completed)   Encounter for annual physical exam - Primary    CPE completed today. Review of HM activities and recommendations discussed and provided on AVS. Anticipatory guidance, diet, and exercise recommendations provided. Medications, allergies, and hx reviewed and updated as necessary. Orders placed as listed below.  Plan: - Labs ordered. Will make changes as necessary based on results.  - I will review these results and send recommendations via MyChart or a telephone call.  - F/U with CPE in 1 year or sooner for acute/chronic health needs as directed.        Relevant Medications   ALPRAZolam (XANAX) 0.25 MG tablet   zolpidem (AMBIEN) 5 MG tablet   Other Relevant Orders   CBC with Differential/Platelet (Completed)   CMP14+EGFR (Completed)   Lipid panel (Completed)   Lesion of liver greater than 1 cm in diameter   Relevant Orders   CBC with Differential/Platelet (Completed)   CMP14+EGFR (Completed)   Lipid panel (Completed)   Iron, TIBC and Ferritin Panel (Completed)   Iron deficiency anemia   Relevant  Orders   Iron, TIBC and Ferritin Panel (Completed)   Other Visit Diagnoses     Need for shingles vaccine       Relevant  Orders   Zoster Recombinant (Shingrix ) (Completed)   Vitamin D deficiency       Relevant Orders   VITAMIN D 25 Hydroxy (Vit-D Deficiency, Fractures) (Completed)   B12 deficiency       Relevant Orders   Vitamin B12 (Completed)          Follow up plan: Return in about 6 months (around 04/21/2023) for Med Management 30.  NEXT PREVENTATIVE PHYSICAL DUE IN 1 YEAR.  PATIENT COUNSELING PROVIDED FOR ALL ADULT PATIENTS: A well balanced diet low in saturated fats, cholesterol, and moderation in carbohydrates.  This can be as simple as monitoring portion sizes and cutting back on sugary beverages such as soda and juice to start with.    Daily water consumption of at least 64 ounces.  Physical activity at least 180 minutes per week.  If just starting out, start 10 minutes a day and work your way up.   This can be as simple as taking the stairs instead of the elevator and walking 2-3 laps around the office  purposefully every day.   STD protection, partner selection, and regular testing if high risk.  Limited consumption of alcoholic beverages if alcohol is consumed. For men, I recommend no more than 14 alcoholic beverages per week, spread out throughout the week (max 2 per day). Avoid "binge" drinking or consuming large quantities of alcohol in one setting.  Please let me know if you feel you may need help with reduction or quitting alcohol consumption.   Avoidance of nicotine, if used. Please let me know if you feel you may need help with reduction or quitting nicotine use.   Daily mental health attention. This can be in the form of 5 minute daily meditation, prayer, journaling, yoga, reflection, etc.  Purposeful attention to your emotions and mental state can significantly improve your overall wellbeing  and  Health.  Please know that I am here to help you with all of your health care goals and am happy to work with you to find a solution that works best for you.  The greatest  advice I have received with any changes in life are to take it one step at a time, that even means if all you can focus on is the next 60 seconds, then do that and celebrate your victories.  With any changes in life, you will have set backs, and that is OK. The important thing to remember is, if you have a set back, it is not a failure, it is an opportunity to try again! Screening Testing Mammogram Every 1 -2 years based on history and risk factors Starting at age 63 Pap Smear Ages 21-39 every 3 years Ages 47-65 every 5 years with HPV testing More frequent testing may be required based on results and history Colon Cancer Screening Every 1-10 years based on test performed, risk factors, and history Starting at age 21 Bone Density Screening Every 2-10 years based on history Starting at age 67 for women Recommendations for men differ based on medication usage, history, and risk factors AAA Screening One time ultrasound Men 39-3 years old who have every smoked Lung Cancer Screening Low Dose Lung CT every 12 months Age 65-80 years with a 30 pack-year smoking history who still smoke or who have quit within the last 15 years  Screening Labs Routine  Labs: Complete Blood Count (CBC), Complete Metabolic Panel (CMP), Cholesterol (Lipid Panel) Every 6-12 months based on history and medications May be recommended more frequently based on current conditions or previous results Hemoglobin A1c Lab Every 3-12 months based on history and previous results Starting at age 70 or earlier with diagnosis of diabetes, high cholesterol, BMI >26, and/or risk factors Frequent monitoring for patients with diabetes to ensure blood sugar control Thyroid Panel (TSH) Every 6 months based on history, symptoms, and risk factors May be repeated more often if on medication HIV One time testing for all patients 10 and older May be repeated more frequently for patients with increased risk factors or  exposure Hepatitis C One time testing for all patients 98 and older May be repeated more frequently for patients with increased risk factors or exposure Gonorrhea, Chlamydia Every 12 months for all sexually active persons 13-24 years Additional monitoring may be recommended for those who are considered high risk or who have symptoms Every 12 months for any woman on birth control, regardless of sexual activity PSA Men 41-29 years old with risk factors Additional screening may be recommended from age 58-69 based on risk factors, symptoms, and history  Vaccine Recommendations Tetanus Booster All adults every 10 years Flu Vaccine All patients 6 months and older every year COVID Vaccine All patients 12 years and older Initial dosing with booster May recommend additional booster based on age and health history HPV Vaccine 2 doses all patients age 62-26 Dosing may be considered for patients over 26 Shingles Vaccine (Shingrix) 2 doses all adults 55 years and older Pneumonia (Pneumovax 23) All adults 65 years and older May recommend earlier dosing based on health history One year apart from Prevnar 13 Pneumonia (Prevnar 46) All adults 65 years and older Dosed 1 year after Pneumovax 23 Pneumonia (Prevnar 20) One time alternative to the two dosing of 13 and 23 For all adults with initial dose of 23, 20 is recommended 1 year later For all adults with initial dose of 13, 23 is still recommended as second option 1 year later

## 2022-10-20 LAB — CBC WITH DIFFERENTIAL/PLATELET
Basophils Absolute: 0 10*3/uL (ref 0.0–0.2)
Basos: 1 %
EOS (ABSOLUTE): 0.2 10*3/uL (ref 0.0–0.4)
Eos: 3 %
Hematocrit: 42.4 % (ref 34.0–46.6)
Hemoglobin: 14.5 g/dL (ref 11.1–15.9)
Immature Grans (Abs): 0 10*3/uL (ref 0.0–0.1)
Immature Granulocytes: 0 %
Lymphocytes Absolute: 1.5 10*3/uL (ref 0.7–3.1)
Lymphs: 25 %
MCH: 32.7 pg (ref 26.6–33.0)
MCHC: 34.2 g/dL (ref 31.5–35.7)
MCV: 96 fL (ref 79–97)
Monocytes Absolute: 0.5 10*3/uL (ref 0.1–0.9)
Monocytes: 8 %
Neutrophils Absolute: 3.6 10*3/uL (ref 1.4–7.0)
Neutrophils: 63 %
Platelets: 251 10*3/uL (ref 150–450)
RBC: 4.43 x10E6/uL (ref 3.77–5.28)
RDW: 11.9 % (ref 11.7–15.4)
WBC: 5.8 10*3/uL (ref 3.4–10.8)

## 2022-10-20 LAB — CMP14+EGFR
ALT: 20 IU/L (ref 0–32)
AST: 18 IU/L (ref 0–40)
Albumin: 4.6 g/dL (ref 3.9–4.9)
Alkaline Phosphatase: 95 IU/L (ref 44–121)
BUN/Creatinine Ratio: 13 (ref 12–28)
BUN: 10 mg/dL (ref 8–27)
Bilirubin Total: 0.7 mg/dL (ref 0.0–1.2)
CO2: 21 mmol/L (ref 20–29)
Calcium: 10 mg/dL (ref 8.7–10.3)
Chloride: 102 mmol/L (ref 96–106)
Creatinine, Ser: 0.8 mg/dL (ref 0.57–1.00)
Globulin, Total: 2.5 g/dL (ref 1.5–4.5)
Glucose: 120 mg/dL — ABNORMAL HIGH (ref 70–99)
Potassium: 4.8 mmol/L (ref 3.5–5.2)
Sodium: 139 mmol/L (ref 134–144)
Total Protein: 7.1 g/dL (ref 6.0–8.5)
eGFR: 81 mL/min/{1.73_m2} (ref >59)

## 2022-10-20 LAB — VITAMIN B12: Vitamin B-12: 1440 pg/mL — ABNORMAL HIGH (ref 232–1245)

## 2022-10-20 LAB — IRON,TIBC AND FERRITIN PANEL
Ferritin: 181 ng/mL — ABNORMAL HIGH (ref 15–150)
Iron Saturation: 52 % (ref 15–55)
Iron: 188 ug/dL — ABNORMAL HIGH (ref 27–139)
Total Iron Binding Capacity: 365 ug/dL (ref 250–450)
UIBC: 177 ug/dL (ref 118–369)

## 2022-10-20 LAB — LIPID PANEL
Chol/HDL Ratio: 3.2 ratio (ref 0.0–4.4)
Cholesterol, Total: 287 mg/dL — ABNORMAL HIGH (ref 100–199)
HDL: 90 mg/dL (ref >39)
LDL Chol Calc (NIH): 178 mg/dL — ABNORMAL HIGH (ref 0–99)
Triglycerides: 114 mg/dL (ref 0–149)
VLDL Cholesterol Cal: 19 mg/dL (ref 5–40)

## 2022-10-20 LAB — VITAMIN D 25 HYDROXY (VIT D DEFICIENCY, FRACTURES): Vit D, 25-Hydroxy: 46.8 ng/mL (ref 30.0–100.0)

## 2022-10-20 LAB — TSH: TSH: 1.07 u[IU]/mL (ref 0.450–4.500)

## 2022-10-27 DIAGNOSIS — F418 Other specified anxiety disorders: Secondary | ICD-10-CM | POA: Insufficient documentation

## 2022-10-27 NOTE — Assessment & Plan Note (Signed)
Chronic. Stable. Well controlled on ambien. Refills provided.

## 2022-10-27 NOTE — Assessment & Plan Note (Signed)
Recent worsening of anxiety symptoms with panic like episodes. Patient is under increased amount of stress with responsibilities. Will start PRN treatment with xanax 0.25mg  PRN. Patient aware if this is needed daily to contact the office for appt to discuss alternative options due to dependence risk and increased risks over age 66 with chronic use

## 2022-10-27 NOTE — Assessment & Plan Note (Signed)
No symptoms. Patient will monitor BP at home and report if readings are consistently >130/80.

## 2022-10-27 NOTE — Assessment & Plan Note (Signed)
Lung ca screening referral placed today. No alarm symptoms present.

## 2022-10-27 NOTE — Assessment & Plan Note (Signed)
Chronic. Lipids monitored. Low fat diet and exercise recommended.

## 2022-11-28 ENCOUNTER — Ambulatory Visit: Payer: Medicare Other

## 2022-11-28 DIAGNOSIS — Z0000 Encounter for general adult medical examination without abnormal findings: Secondary | ICD-10-CM

## 2022-11-28 NOTE — Patient Instructions (Signed)
Tara Hendrix , Thank you for taking time to come for your Medicare Wellness Visit. I appreciate your ongoing commitment to your health goals. Please review the following plan we discussed and let me know if I can assist you in the future.   Referrals/Orders/Follow-Ups/Clinician Recommendations: none  This is a list of the screening recommended for you and due dates:  Health Maintenance  Topic Date Due   Screening for Lung Cancer  10/25/2022   COVID-19 Vaccine (5 - 2023-24 season) 10/29/2022   Flu Shot  05/28/2023*   Pneumonia Vaccine (2 of 2 - PCV) 10/27/2023*   Zoster (Shingles) Vaccine (2 of 2) 12/14/2022   Mammogram  05/11/2023   Medicare Annual Wellness Visit  11/28/2023   Colon Cancer Screening  04/01/2026   DTaP/Tdap/Td vaccine (3 - Td or Tdap) 08/10/2030   DEXA scan (bone density measurement)  Completed   Hepatitis C Screening  Completed   HPV Vaccine  Aged Out  *Topic was postponed. The date shown is not the original due date.    Advanced directives: (Copy Requested) Please bring a copy of your health care power of attorney and living will to the office to be added to your chart at your convenience.  Next Medicare Annual Wellness Visit scheduled for next year: Yes  Insert Preventive Care attachment Insert FALL PREVENTION attachment if needed

## 2022-11-28 NOTE — Progress Notes (Signed)
Subjective:   Tara Hendrix is a 67 y.o. female who presents for an Initial Medicare Annual Wellness Visit.  Visit Complete: Virtual  I connected with  Tara Hendrix on 11/28/22 by a audio enabled telemedicine application and verified that I am speaking with the correct person using two identifiers.  Patient Location: Home  Provider Location: Office/Clinic  I discussed the limitations of evaluation and management by telemedicine. The patient expressed understanding and agreed to proceed.  Patient Medicare AWV questionnaire was completed by the patient on 11/27/2022; I have confirmed that all information answered by patient is correct and no changes since this date.  Because this visit was a virtual/telehealth visit, some criteria may be missing or patient reported. Any vitals not documented were not able to be obtained and vitals that have been documented are patient reported.   Cardiac Risk Factors include: advanced age (>10men, >64 women)     Objective:    Today's Vitals   There is no height or weight on file to calculate BMI.     11/28/2022    2:00 PM 11/04/2021    4:11 PM 12/27/2018   10:45 AM  Advanced Directives  Does Patient Have a Medical Advance Directive? Yes No No  Type of Estate agent of [place];Living will    Copy of Healthcare Power of Attorney in Chart? No - copy requested    Would patient like information on creating a medical advance directive?   No - Patient declined    Current Medications (verified) Outpatient Encounter Medications as of 11/28/2022  Medication Sig   Cholecalciferol (VITAMIN D3) 50 MCG (2000 UT) TABS Take 1 tablet by mouth daily.   pantoprazole (PROTONIX) 40 MG tablet Take 1 tablet (40 mg total) by mouth 2 (two) times daily.   thiamine (VITAMIN B-1) 100 MG tablet Take 100 mg by mouth daily.   vitamin B-12 (CYANOCOBALAMIN) 100 MCG tablet Take 100 mcg by mouth daily.   zolpidem (AMBIEN) 5 MG tablet Take 1  tablet (5 mg total) by mouth at bedtime as needed for sleep.   ALPRAZolam (XANAX) 0.25 MG tablet Take 1 tablet (0.25 mg total) by mouth 2 (two) times daily as needed for anxiety. (Patient not taking: Reported on 11/28/2022)   No facility-administered encounter medications on file as of 11/28/2022.    Allergies (verified) Patient has no known allergies.   History: Past Medical History:  Diagnosis Date   Allergy    Anemia, iron deficiency    Constipation    Gastric polyps 03/2019   hyperplastic   GERD (gastroesophageal reflux disease)    Hiatal hernia    History of chickenpox    Hx of adenomatous colonic polyps 04/07/2019   Insomnia    Internal hemorrhoids    Kidney stone    Seasonal allergies    Tubular adenoma of colon 09/13/2011   removed from the decending colon   Past Surgical History:  Procedure Laterality Date   BREAST BIOPSY Left 2020   BUNIONECTOMY Right 12/15/2011   COLONOSCOPY     COLONOSCOPY W/ BIOPSIES  09/13/2011   ESOPHAGOGASTRODUODENOSCOPY  09/13/2011   TONSILLECTOMY  1964   UPPER GASTROINTESTINAL ENDOSCOPY     Family History  Problem Relation Age of Onset   Heart attack Mother    Hypertension Mother    Lung cancer Mother    Hearing loss Father    Drug abuse Brother    Cancer Paternal Grandmother        unsure type  Stroke Paternal Grandfather    Intellectual disability Paternal Aunt    Breast cancer Neg Hx    Colon cancer Neg Hx    Esophageal cancer Neg Hx    Rectal cancer Neg Hx    Stomach cancer Neg Hx    Social History   Socioeconomic History   Marital status: Married    Spouse name: Not on file   Number of children: 0   Years of education: Not on file   Highest education level: Not on file  Occupational History   Occupation: Sales  Tobacco Use   Smoking status: Former    Current packs/day: 0.00    Average packs/day: 1 pack/day for 20.0 years (20.0 ttl pk-yrs)    Types: Cigarettes    Start date: 08/28/1998    Quit date: 08/28/2018     Years since quitting: 4.2   Smokeless tobacco: Never  Vaping Use   Vaping status: Former  Substance and Sexual Activity   Alcohol use: Yes    Comment: 4 per day   Drug use: No   Sexual activity: Yes    Partners: Male    Birth control/protection: Surgical, Post-menopausal    Comment: vasectomy  Other Topics Concern   Not on file  Social History Narrative   She is married without children   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   3 alcoholic beverages a day   3 caffeinated beverages a day   No drug or tobacco use former smoker   Social Determinants of Corporate investment banker Strain: Low Risk  (11/27/2022)   Overall Financial Resource Strain (CARDIA)    Difficulty of Paying Living Expenses: Not hard at all  Food Insecurity: No Food Insecurity (11/27/2022)   Hunger Vital Sign    Worried About Running Out of Food in the Last Year: Never true    Ran Out of Food in the Last Year: Never true  Transportation Needs: No Transportation Needs (11/27/2022)   PRAPARE - Administrator, Civil Service (Medical): No    Lack of Transportation (Non-Medical): No  Physical Activity: Sufficiently Active (11/27/2022)   Exercise Vital Sign    Days of Exercise per Week: 7 days    Minutes of Exercise per Session: 30 min  Stress: Stress Concern Present (11/27/2022)   [employer] of Occupational Health - Occupational Stress Questionnaire    Feeling of Stress : Rather much  Social Connections: Moderately Integrated (11/27/2022)   Social Connection and Isolation Panel [NHANES]    Frequency of Communication with Friends and Family: More than three times a week    Frequency of Social Gatherings with Friends and Family: Twice a week    Attends Religious Services: Never    Database administrator or Organizations: Yes    Attends Engineer, structural: More than 4 times per year    Marital Status: Married    Tobacco  Counseling Counseling given: Not Answered   Clinical Intake:  Pre-visit preparation completed: Yes  Pain : No/denies pain     Nutritional Risks: None Diabetes: No  How often do you need to have someone help you when you read instructions, pamphlets, or other written materials from your doctor or pharmacy?: 1 - Never  Interpreter Needed?: No  Information entered by :: NAllen LPN   Activities of Daily Living    11/27/2022    2:36 PM  In your present state of  health, do you have any difficulty performing the following activities:  Hearing? 0  Vision? 0  Difficulty concentrating or making decisions? 0  Walking or climbing stairs? 0  Dressing or bathing? 0  Doing errands, shopping? 0  Preparing Food and eating ? N  Using the Toilet? N  In the past six months, have you accidently leaked urine? N  Do you have problems with loss of bowel control? N  Managing your Medications? N  Managing your Finances? N  Housekeeping or managing your Housekeeping? N    Patient Care Team: Early, Sung Amabile, NP as PCP - General (Nurse Practitioner) Jerene Bears, MD as Consulting Physician (Gynecology) Janalyn Harder, MD (Inactive) as Consulting Physician (Dermatology)  Indicate any recent Medical Services you may have received from other than [facility] providers in the past year (date may be approximate).     Assessment:   This is a routine wellness examination for Tara Hendrix.  Hearing/Vision screen Hearing Screening - Comments:: Denies hearing issues Vision Screening - Comments:: Regular eye exams, [facility]   Goals Addressed             This Visit's Progress    Patient Stated       11/28/2022, denies goals at this time       Depression Screen    11/28/2022    2:01 PM 10/19/2022    8:28 AM 10/17/2021    8:20 AM 09/12/2021    1:45 PM 06/07/2021    2:39 PM 08/31/2020    8:37 AM 08/09/2020    8:14 AM  PHQ 2/9 Scores  PHQ - 2 Score 0 0 0 0 0 0 0  PHQ- 9 Score 2  4   3    Exception  Documentation   Medical reason  Medical reason      Fall Risk    11/27/2022    2:36 PM 10/19/2022    8:28 AM 09/26/2022   11:31 AM 10/17/2021    8:20 AM 06/07/2021    2:39 PM  Fall Risk   Falls in the past year? 1 1 0 0 0  Comment fell down the steps      Number falls in past yr: 0 0 0 0 0  Injury with Fall? 0 0 0 0 0  Risk for fall due to : Medication side effect No Fall Risks No Fall Risks No Fall Risks No Fall Risks  Follow up Falls prevention discussed;Falls evaluation completed Falls evaluation completed Falls evaluation completed Education provided;Falls evaluation completed Falls evaluation completed;Education provided    MEDICARE RISK AT HOME: Medicare Risk at Home Any stairs in or around the home?: Yes If so, are there any without handrails?: No Home free of loose throw rugs in walkways, pet beds, electrical cords, etc?: No Adequate lighting in your home to reduce risk of falls?: Yes Life alert?: No Grab bars in the bathroom?: No Shower chair or bench in shower?: No Elevated toilet seat or a handicapped toilet?: No  TIMED UP AND GO:  Was the test performed? No    Cognitive Function:        11/28/2022    2:02 PM  6CIT Screen  What Year? 0 points  What month? 0 points  What time? 0 points  Count back from 20 0 points  Months in reverse 0 points  Repeat phrase 0 points  Total Score 0 points    Immunizations Immunization History  Administered Date(s) Administered   Influenza, Quadrivalent, Recombinant, Inj, Pf 11/27/2021  Influenza,inj,Quad PF,6+ Mos 11/21/2018   PFIZER(Purple Top)SARS-COV-2 Vaccination 05/19/2019, 06/16/2019, 12/23/2019, 09/22/2020   Pneumococcal Polysaccharide-23 02/01/2016, 10/17/2021   Tdap 10/23/2010, 08/09/2020   Zoster Recombinant(Shingrix) 10/19/2022   Zoster, Live 02/28/2011    TDAP status: Up to date  Flu Vaccine status: Due, Education has been provided regarding the importance of this vaccine. Advised may receive this vaccine  at local pharmacy or Health Dept. Aware to provide a copy of the vaccination record if obtained from local pharmacy or Health Dept. Verbalized acceptance and understanding.  Pneumococcal vaccine status: Up to date  Covid-19 vaccine status: Information provided on how to obtain vaccines.   Qualifies for Shingles Vaccine? Yes   Zostavax completed Yes   Shingrix Completed?: needs second dose  Screening Tests Health Maintenance  Topic Date Due   Lung Cancer Screening  10/25/2022   COVID-19 Vaccine (5 - 2023-24 season) 10/29/2022   INFLUENZA VACCINE  05/28/2023 (Originally 09/28/2022)   Pneumonia Vaccine 67+ Years old (2 of 2 - PCV) 10/27/2023 (Originally 10/18/2022)   Zoster Vaccines- Shingrix (2 of 2) 12/14/2022   MAMMOGRAM  05/11/2023   Medicare Annual Wellness (AWV)  11/28/2023   Colonoscopy  04/01/2026   DTaP/Tdap/Td (3 - Td or Tdap) 08/10/2030   DEXA SCAN  Completed   Hepatitis C Screening  Completed   HPV VACCINES  Aged Out    Health Maintenance  Health Maintenance Due  Topic Date Due   Lung Cancer Screening  10/25/2022   COVID-19 Vaccine (5 - 2023-24 season) 10/29/2022    Colorectal cancer screening: Type of screening: Colonoscopy. Completed 04/02/2019. Repeat every 7 years  Mammogram status: Completed 05/11/2022. Repeat every year  Bone Density status: Completed 05/17/2020.   Lung Cancer Screening: (Low Dose CT Chest recommended if Age 28-80 years, 20 pack-year currently smoking OR have quit w/in 15years.) does qualify.   Lung Cancer Screening Referral: ordered CT scan 10/19/2022  Additional Screening:  Hepatitis C Screening: does qualify; Completed 09/27/2015  Vision Screening: Recommended annual ophthalmology exams for early detection of glaucoma and other disorders of the eye. Is the patient up to date with their annual eye exam?  Yes  Who is the provider or what is the name of the office in which the patient attends annual eye exams? [facility] If pt is not  established with a provider, would they like to be referred to a provider to establish care? No .   Dental Screening: Recommended annual dental exams for proper oral hygiene  Diabetic Foot Exam: n/a  Community Resource Referral / Chronic Care Management: CRR required this visit?  No   CCM required this visit?  No     Plan:     I have personally reviewed and noted the following in the patient's chart:   Medical and social history Use of alcohol, tobacco or illicit drugs  Current medications and supplements including opioid prescriptions. Patient is not currently taking opioid prescriptions. Functional ability and status Nutritional status Physical activity Advanced directives List of other physicians Hospitalizations, surgeries, and ER visits in previous 12 months Vitals Screenings to include cognitive, depression, and falls Referrals and appointments  In addition, I have reviewed and discussed with patient certain preventive protocols, quality metrics, and best practice recommendations. A written personalized care plan for preventive services as well as general preventive health recommendations were provided to patient.     Barb Merino, LPN   16/02/958   After Visit Summary: (MyChart) Due to this being a telephonic visit, the after visit  summary with patients personalized plan was offered to patient via MyChart   Nurse Notes: none

## 2022-12-04 ENCOUNTER — Other Ambulatory Visit (INDEPENDENT_AMBULATORY_CARE_PROVIDER_SITE_OTHER): Payer: Medicare Other

## 2022-12-04 DIAGNOSIS — Z23 Encounter for immunization: Secondary | ICD-10-CM

## 2022-12-19 ENCOUNTER — Other Ambulatory Visit: Payer: Self-pay

## 2022-12-19 DIAGNOSIS — Z122 Encounter for screening for malignant neoplasm of respiratory organs: Secondary | ICD-10-CM

## 2022-12-19 DIAGNOSIS — Z87891 Personal history of nicotine dependence: Secondary | ICD-10-CM

## 2022-12-22 ENCOUNTER — Ambulatory Visit (INDEPENDENT_AMBULATORY_CARE_PROVIDER_SITE_OTHER): Payer: Medicare Other | Admitting: Acute Care

## 2022-12-22 DIAGNOSIS — Z87891 Personal history of nicotine dependence: Secondary | ICD-10-CM

## 2022-12-22 NOTE — Patient Instructions (Signed)

## 2022-12-22 NOTE — Progress Notes (Addendum)
 Provider Attestation I agree with the documentation of the Shared Decision Making visit,  smoking cessation counseling if appropriate, and verification or eligibility for lung cancer screening as documented by the RN Nurse Navigator.   Lauraine PHEBE Lites, MSN, AGACNP-BC [facility] See Amion for personal pager PCCM on call pager [phone]     Shared Decision Making Visit Lung Cancer Screening Program [phone])   Virtual Visit via Telephone Note  I connected with Tara Hendrix on 12/22/22 at  9:00 AM EDT by telephone and verified that I am speaking with the correct person using two identifiers.  Location: Patient: Tara Hendrix Provider: Laneta Speaks, RN   I discussed the limitations, risks, security and privacy concerns of performing an evaluation and management service by telephone and the availability of in person appointments. I also discussed with the patient that there may be a patient responsible charge related to this service. The patient expressed understanding and agreed to proceed.   Eligibility: Age 67 y.o. Pack Years Smoking History Calculation 36 (# packs/per year x # years smoked) Recent History of coughing up blood  no Unexplained weight loss? no ( >Than 15 pounds within the last 6 months ) Prior History Lung / other cancer no (Diagnosis within the last 5 years already requiring surveillance chest CT Scans). Smoking Status Former Smoker Former Smokers: Years since quit: 4 years  Quit Date: 02/27/2018  Visit Components: Discussion included one or more decision making aids. yes Discussion included risk/benefits of screening. yes Discussion included potential follow up diagnostic testing for abnormal scans. yes Discussion included meaning and risk of over diagnosis. yes Discussion included meaning and risk of False Positives. yes Discussion included meaning of total radiation exposure. yes  Counseling  Included: Importance of adherence to annual lung cancer LDCT screening. yes Impact of comorbidities on ability to participate in the program. yes Ability and willingness to under diagnostic treatment. yes  Former Smokers:  Discussed the importance of maintaining cigarette abstinence. yes Diagnosis Code: Personal History of Nicotine Dependence. S12.108 Information about tobacco cessation classes and interventions provided to patient. Yes  Written Order for Lung Cancer Screening with LDCT placed in Epic. Yes (CT Chest Lung Cancer Screening Low Dose W/O CM) [id number] Z12.2-Screening of respiratory organs Z87.891-Personal history of nicotine dependence   Laneta Speaks, RN

## 2022-12-26 ENCOUNTER — Other Ambulatory Visit: Payer: Self-pay | Admitting: Gastroenterology

## 2022-12-26 DIAGNOSIS — K2970 Gastritis, unspecified, without bleeding: Secondary | ICD-10-CM

## 2022-12-27 ENCOUNTER — Ambulatory Visit
Admission: RE | Admit: 2022-12-27 | Discharge: 2022-12-27 | Disposition: A | Discharge status: Home / Self Care | Payer: Medicare Other | Source: Ambulatory Visit | Attending: Nurse Practitioner | Admitting: Nurse Practitioner

## 2022-12-27 DIAGNOSIS — Z122 Encounter for screening for malignant neoplasm of respiratory organs: Secondary | ICD-10-CM

## 2022-12-27 DIAGNOSIS — Z87891 Personal history of nicotine dependence: Secondary | ICD-10-CM

## 2023-01-01 ENCOUNTER — Other Ambulatory Visit: Payer: Medicare Other

## 2023-01-02 ENCOUNTER — Encounter: Payer: Self-pay | Admitting: Internal Medicine

## 2023-01-02 ENCOUNTER — Ambulatory Visit: Payer: Medicare Other | Admitting: Internal Medicine

## 2023-01-02 VITALS — BP 110/80 | HR 81 | Ht 66.0 in | Wt 142.2 lb

## 2023-01-02 DIAGNOSIS — K2970 Gastritis, unspecified, without bleeding: Secondary | ICD-10-CM | POA: Diagnosis not present

## 2023-01-02 DIAGNOSIS — K449 Diaphragmatic hernia without obstruction or gangrene: Secondary | ICD-10-CM

## 2023-01-02 DIAGNOSIS — K2100 Gastro-esophageal reflux disease with esophagitis, without bleeding: Secondary | ICD-10-CM

## 2023-01-02 MED ORDER — PANTOPRAZOLE SODIUM 40 MG PO TBEC
40.0000 mg | DELAYED_RELEASE_TABLET | Freq: Two times a day (BID) | ORAL | 3 refills | Status: DC
Start: 2023-01-02 — End: 2023-09-10

## 2023-01-02 NOTE — Progress Notes (Signed)
Tara Hendrix 67 y.o. [birth year] [id number]  Assessment & Plan:   Encounter Diagnoses  Name Primary?   Gastroesophageal reflux disease with esophagitis without hemorrhage Yes   Hiatal hernia    Gastritis without bleeding, unspecified chronicity, unspecified gastritis type    She will continue on twice daily pantoprazole.  Refill sent for 1 year today.  Follow-up in approximately 1 year.  She will let us know when it is a good time to consider TIF plus hiatal hernia repair.  I explained she could need repeat endoscopy or other imaging prior to that depending upon the time course and she understands.    Subjective:  Gastroenterology problem summary:   GERD-many years prior Dr. Loreta Ave patient, [facility] 2021 symptoms of heartburn and reflux and regurgitation with hiatal hernia EGD Dr. Barron Alvine 03/03/2022-LA grade B reflux esophagitis, benign-appearing stricture dilated (size not documented), 5 cm hiatal hernia and gastritis suspected.  The GE junction biopsies were consistent with reflux, there was no gastritis on pathology.  Recommended to take twice daily pantoprazole 40 mg for about 6 weeks and then go back to daily.  She saw Dr. Andrey Campanile 04/27/2022 to consider hiatal hernia repair plus TIF.  She wanted to think about it.   Abnormal duodenal biopsy with intraepithelial lymphocytosis 2013-2021-normal biopsies   History of colon adenomas-diminutive 2013, 2 diminutive 2021-recall 2028   Chief Complaint: GERD follow-up  HPI 67 year old white woman with a history as outlined above, with GERD and hiatal hernia.  She was unable to reduce to daily PPI.  Show symptoms are controlled on pantoprazole 40 mg twice daily.  She is still interested in TIF and hiatal hernia repair but her husband Pleas Patricia has metastatic cancer and history of GIST with severe diarrhea problems, (also a patient of mine) and requires a lot of help and care so it is just not a good time of life to do so.  So  heartburn and dysphagia symptoms are well-controlled at this time.  No other medical issues reported today. No Known Allergies Current Meds  Medication Sig   ALPRAZolam (XANAX) 0.25 MG tablet Take 1 tablet (0.25 mg total) by mouth 2 (two) times daily as needed for anxiety.   Cholecalciferol (VITAMIN D3) 50 MCG (2000 UT) TABS Take 1 tablet by mouth daily.   pantoprazole (PROTONIX) 40 MG tablet TAKE 1 TABLET BY MOUTH TWICE  DAILY   thiamine (VITAMIN B-1) 100 MG tablet Take 100 mg by mouth daily.   vitamin B-12 (CYANOCOBALAMIN) 100 MCG tablet Take 100 mcg by mouth daily.   zolpidem (AMBIEN) 5 MG tablet Take 1 tablet (5 mg total) by mouth at bedtime as needed for sleep.   Past Medical History:  Diagnosis Date   Allergy    Anemia, iron deficiency    Constipation    Gastric polyps 03/2019   hyperplastic   GERD (gastroesophageal reflux disease)    Hiatal hernia    History of chickenpox    Hx of adenomatous colonic polyps 04/07/2019   Insomnia    Internal hemorrhoids    Kidney stone    Seasonal allergies    Tubular adenoma of colon 09/13/2011   removed from the decending colon   Past Surgical History:  Procedure Laterality Date   BREAST BIOPSY Left 2020   BUNIONECTOMY Right 12/15/2011   COLONOSCOPY     COLONOSCOPY W/ BIOPSIES  09/13/2011   ESOPHAGOGASTRODUODENOSCOPY  09/13/2011   TONSILLECTOMY  1964   UPPER GASTROINTESTINAL ENDOSCOPY     Social History  Social History Narrative   She is married without children   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   3 alcoholic beverages a day   3 caffeinated beverages a day   No drug or tobacco use former smoker   family history includes Cancer in her paternal grandmother; Drug abuse in her brother; Hearing loss in her father; Heart attack in her mother; Hypertension in her mother; Intellectual disability in her paternal aunt; Lung cancer in her mother; Stroke in her paternal  grandfather.   Review of Systems As per HPI  Objective:   Physical Exam BP 110/80 (BP Location: Left Arm, Patient Position: Sitting, Cuff Size: Normal)   Pulse 81   Ht 5\' 6"  (1.676 m)   Wt 142 lb 4 oz (64.5 kg)   LMP 01/11/2014 (Exact Date)   SpO2 99%   BMI 22.96 kg/m  No acute distress well-developed well-nourished    Data reviewed includes January 2024 EGD with Dr. Barron Alvine including biopsy reports and the 04/27/2018 for consultation with Dr. Andrey Campanile of [facility] surgery.

## 2023-01-02 NOTE — Patient Instructions (Signed)
We have sent the following medications to your pharmacy for you to pick up at your convenience: Pantoprazole 40 mg: take twice daily  Follow up in 1 year.  Due to recent changes in healthcare laws, you may see the results of your imaging and laboratory studies on MyChart before your provider has had a chance to review them.  We understand that in some cases there may be results that are confusing or concerning to you. Not all laboratory results come back in the same time frame and the provider may be waiting for multiple results in order to interpret others.  Please give [place] 48 hours in order for your provider to thoroughly review all the results before contacting the office for clarification of your results.   Thank you for choosing me and [facility].  Iva Boop, M.D., [facility]

## 2023-01-08 ENCOUNTER — Other Ambulatory Visit (INDEPENDENT_AMBULATORY_CARE_PROVIDER_SITE_OTHER): Payer: Medicare Other

## 2023-01-08 DIAGNOSIS — Z23 Encounter for immunization: Secondary | ICD-10-CM

## 2023-01-29 ENCOUNTER — Other Ambulatory Visit: Payer: Self-pay

## 2023-01-29 DIAGNOSIS — Z122 Encounter for screening for malignant neoplasm of respiratory organs: Secondary | ICD-10-CM

## 2023-01-29 DIAGNOSIS — Z87891 Personal history of nicotine dependence: Secondary | ICD-10-CM

## 2023-03-10 ENCOUNTER — Other Ambulatory Visit: Payer: Self-pay | Admitting: Nurse Practitioner

## 2023-03-10 DIAGNOSIS — Z0000 Encounter for general adult medical examination without abnormal findings: Secondary | ICD-10-CM

## 2023-03-10 DIAGNOSIS — F5101 Primary insomnia: Secondary | ICD-10-CM

## 2023-03-12 NOTE — Telephone Encounter (Signed)
 Last apt. 10/19/22.

## 2023-04-13 ENCOUNTER — Other Ambulatory Visit: Payer: Self-pay | Admitting: Obstetrics & Gynecology

## 2023-04-13 DIAGNOSIS — Z1231 Encounter for screening mammogram for malignant neoplasm of breast: Secondary | ICD-10-CM

## 2023-04-22 ENCOUNTER — Other Ambulatory Visit: Payer: Self-pay | Admitting: Nurse Practitioner

## 2023-04-22 DIAGNOSIS — Z0000 Encounter for general adult medical examination without abnormal findings: Secondary | ICD-10-CM

## 2023-04-22 DIAGNOSIS — F5101 Primary insomnia: Secondary | ICD-10-CM

## 2023-04-23 NOTE — Telephone Encounter (Signed)
 Last apt 10/17/22

## 2023-05-21 ENCOUNTER — Ambulatory Visit
Admission: RE | Admit: 2023-05-21 | Discharge: 2023-05-21 | Disposition: A | Discharge status: Home / Self Care | Payer: Medicare Other | Source: Ambulatory Visit | Attending: Obstetrics & Gynecology | Admitting: Obstetrics & Gynecology

## 2023-05-21 DIAGNOSIS — Z1231 Encounter for screening mammogram for malignant neoplasm of breast: Secondary | ICD-10-CM

## 2023-06-12 ENCOUNTER — Other Ambulatory Visit: Payer: Self-pay | Admitting: Nurse Practitioner

## 2023-06-12 DIAGNOSIS — F5101 Primary insomnia: Secondary | ICD-10-CM

## 2023-06-12 DIAGNOSIS — Z0000 Encounter for general adult medical examination without abnormal findings: Secondary | ICD-10-CM

## 2023-06-12 NOTE — Telephone Encounter (Signed)
 Last apt. 10/19/22.

## 2023-07-17 ENCOUNTER — Other Ambulatory Visit: Payer: Self-pay | Admitting: Nurse Practitioner

## 2023-07-17 DIAGNOSIS — F5101 Primary insomnia: Secondary | ICD-10-CM

## 2023-07-17 DIAGNOSIS — Z0000 Encounter for general adult medical examination without abnormal findings: Secondary | ICD-10-CM

## 2023-07-17 NOTE — Telephone Encounter (Signed)
 Last apt 10/19/22 next apt 10/23/23.

## 2023-08-20 ENCOUNTER — Other Ambulatory Visit: Payer: Self-pay | Admitting: Family Medicine

## 2023-08-20 DIAGNOSIS — F5101 Primary insomnia: Secondary | ICD-10-CM

## 2023-08-20 DIAGNOSIS — Z0000 Encounter for general adult medical examination without abnormal findings: Secondary | ICD-10-CM

## 2023-09-08 ENCOUNTER — Other Ambulatory Visit: Payer: Self-pay | Admitting: Internal Medicine

## 2023-09-08 DIAGNOSIS — K2970 Gastritis, unspecified, without bleeding: Secondary | ICD-10-CM

## 2023-09-17 ENCOUNTER — Other Ambulatory Visit (HOSPITAL_COMMUNITY)
Admission: RE | Admit: 2023-09-17 | Discharge: 2023-09-17 | Disposition: A | Discharge status: Home / Self Care | Source: Ambulatory Visit | Attending: Obstetrics & Gynecology | Admitting: Obstetrics & Gynecology

## 2023-09-17 ENCOUNTER — Encounter (HOSPITAL_BASED_OUTPATIENT_CLINIC_OR_DEPARTMENT_OTHER): Payer: Self-pay | Admitting: Obstetrics & Gynecology

## 2023-09-17 ENCOUNTER — Ambulatory Visit (HOSPITAL_BASED_OUTPATIENT_CLINIC_OR_DEPARTMENT_OTHER): Payer: Medicare Other | Admitting: Obstetrics & Gynecology

## 2023-09-17 VITALS — BP 158/76 | HR 68 | Wt 153.6 lb

## 2023-09-17 DIAGNOSIS — Z860101 Personal history of adenomatous and serrated colon polyps: Secondary | ICD-10-CM | POA: Diagnosis not present

## 2023-09-17 DIAGNOSIS — Z124 Encounter for screening for malignant neoplasm of cervix: Secondary | ICD-10-CM | POA: Insufficient documentation

## 2023-09-17 DIAGNOSIS — Z780 Asymptomatic menopausal state: Secondary | ICD-10-CM | POA: Diagnosis not present

## 2023-09-17 DIAGNOSIS — Z01419 Encounter for gynecological examination (general) (routine) without abnormal findings: Secondary | ICD-10-CM

## 2023-09-17 NOTE — Progress Notes (Signed)
 Breast and Exam Patient name: Tara Hendrix MRN [id number]  Date of birth: 12/22/1955 Chief Complaint:   Breast and pelvic exam  History of Present Illness:   Tara Hendrix is a 68 y.o. G38P0010 Caucasian female being seen today for a breast and pelvic exam.  Caring for husband so this is stressful.  Denies vaginal bleeding.    Having some elevated blood pressures.  She is seeing her PCP, Lauraine Doing, this fall and is going to discuss then.  Gives blood and it is elevated then as well.    Patient's last menstrual period was 01/11/2014 (exact date).    Last pap 2022. Results were: negative. H/O abnormal pap: no Last mammogram: 05/21/2023. Results were: normal. Family h/o breast cancer: no Last colonoscopy: 04/02/2019. Results were: abnormal adenomatous polyps.  Follow up 7 years. Family h/o colorectal cancer: no Dexa:  2022.  Normal.       11/28/2022    2:01 PM 10/19/2022    8:28 AM 10/17/2021    8:20 AM 09/12/2021    1:45 PM 06/07/2021    2:39 PM  Depression screen PHQ 2/9  Decreased Interest 0 0 0 0 0  Down, Depressed, Hopeless 0 0 0 0 0  PHQ - 2 Score 0 0 0 0 0  Altered sleeping 2  3    Tired, decreased energy 0  0    Change in appetite 0  0    Feeling bad or failure about yourself  0  0    Trouble concentrating 0  0    Moving slowly or fidgety/restless 0  1    Suicidal thoughts 0  0    PHQ-9 Score 2  4    Difficult doing work/chores Not difficult at all  Not difficult at all      Review of Systems:   Pertinent items are noted in HPI Denies any urinary or bowel changes or pelvic pain.   Pertinent History Reviewed:  Reviewed past medical,surgical, social and family history.  Reviewed problem list, medications and allergies. Physical Assessment:   Vitals:   09/17/23 0827  BP: (!) 158/76  Pulse: 68  SpO2: 98%  Weight: 153 lb 9.6 oz (69.7 kg)  Body mass index is 24.79 kg/m.        Physical Examination:   General appearance - well appearing, and in no  distress  Mental status - alert, oriented to person, place, and time  Psych:  She has a normal mood and affect  Skin - warm and dry, normal color, no suspicious lesions noted  Chest - effort normal, all lung fields clear to auscultation bilaterally  Heart - normal rate and regular rhythm  Neck:  midline trachea, no thyromegaly or nodules  Breasts - breasts appear normal, no suspicious masses, no skin or nipple changes or  axillary nodes  Abdomen - soft, nontender, nondistended, no masses or organomegaly  Pelvic - VULVA: normal appearing vulva with no masses, tenderness or lesions   VAGINA: normal appearing vagina with normal color and discharge, no lesions   CERVIX: normal appearing cervix without discharge or lesions, no CMT  Thin prep pap is done with HR HPV cotesting  UTERUS: uterus is felt to be normal size, shape, consistency and nontender   ADNEXA: No adnexal masses or tenderness noted.  Rectal - normal rectal, good sphincter tone, no masses felt.  Extremities:  No swelling or varicosities noted  Chaperone present for exam  No results found for this or any  previous visit (from the past 24 hours).  Assessment & Plan:  1. Encntr for gyn exam (general) (routine) w/o abn findings (Primary) - Pap smear updated today.  Guidelines reviewed.  She desires this done today. - Mammogram 04/2023 - Colonoscopy 2021.  Follow up 7 years. - Bone mineral density 2022.  Normal - lab work done with PCP, Camie Early - vaccines reviewed/updated  2. Cervical cancer screening - Cytology - PAP( [place]) - PR OBTAINING SCREEN PAP SMEAR  3. Postmenopausal - taking vit d 2000IU daily.  Calcium comes form dietary sources.    4. Hx of adenomatous colonic polyps   Orders Placed This Encounter  Procedures   PR OBTAINING SCREEN PAP SMEAR    Meds: No orders of the defined types were placed in this encounter.   Follow-up: Return for 1-2 years or with any new concerns/problems.  Ronal GORMAN P[place],  MD 09/17/2023 9:07 AM

## 2023-09-19 ENCOUNTER — Encounter: Payer: Self-pay | Admitting: Acute Care

## 2023-09-20 ENCOUNTER — Ambulatory Visit (HOSPITAL_BASED_OUTPATIENT_CLINIC_OR_DEPARTMENT_OTHER): Payer: Self-pay | Admitting: Obstetrics & Gynecology

## 2023-09-20 DIAGNOSIS — B9689 Other specified bacterial agents as the cause of diseases classified elsewhere: Secondary | ICD-10-CM

## 2023-09-20 LAB — CYTOLOGY - PAP
Adequacy: ABSENT
Diagnosis: NEGATIVE

## 2023-09-24 ENCOUNTER — Other Ambulatory Visit: Payer: Self-pay | Admitting: Nurse Practitioner

## 2023-09-24 DIAGNOSIS — F5101 Primary insomnia: Secondary | ICD-10-CM

## 2023-09-24 DIAGNOSIS — Z0000 Encounter for general adult medical examination without abnormal findings: Secondary | ICD-10-CM

## 2023-09-24 NOTE — Telephone Encounter (Signed)
 Last apt 10/19/22 next apt 10/23/23.

## 2023-10-03 ENCOUNTER — Other Ambulatory Visit (HOSPITAL_BASED_OUTPATIENT_CLINIC_OR_DEPARTMENT_OTHER): Payer: Self-pay | Admitting: Obstetrics & Gynecology

## 2023-10-03 MED ORDER — METRONIDAZOLE 500 MG PO TABS: 500.0000 mg | ORAL_TABLET | Freq: Two times a day (BID) | ORAL | 0 refills | Status: DC

## 2023-10-22 ENCOUNTER — Other Ambulatory Visit: Payer: Self-pay | Admitting: Nurse Practitioner

## 2023-10-22 DIAGNOSIS — Z0000 Encounter for general adult medical examination without abnormal findings: Secondary | ICD-10-CM

## 2023-10-22 DIAGNOSIS — F5101 Primary insomnia: Secondary | ICD-10-CM

## 2023-10-22 NOTE — Telephone Encounter (Signed)
 Last apt 10/19/22 next apt 10/23/23.

## 2023-10-23 ENCOUNTER — Ambulatory Visit (INDEPENDENT_AMBULATORY_CARE_PROVIDER_SITE_OTHER): Payer: Medicare Other | Admitting: Nurse Practitioner

## 2023-10-23 ENCOUNTER — Encounter: Payer: Self-pay | Admitting: Nurse Practitioner

## 2023-10-23 VITALS — BP 150/88 | HR 79 | Ht 65.0 in | Wt 152.2 lb

## 2023-10-23 DIAGNOSIS — D509 Iron deficiency anemia, unspecified: Secondary | ICD-10-CM

## 2023-10-23 DIAGNOSIS — I700 Atherosclerosis of aorta: Secondary | ICD-10-CM

## 2023-10-23 DIAGNOSIS — E559 Vitamin D deficiency, unspecified: Secondary | ICD-10-CM

## 2023-10-23 DIAGNOSIS — R002 Palpitations: Secondary | ICD-10-CM | POA: Insufficient documentation

## 2023-10-23 DIAGNOSIS — R030 Elevated blood-pressure reading, without diagnosis of hypertension: Secondary | ICD-10-CM

## 2023-10-23 DIAGNOSIS — E538 Deficiency of other specified B group vitamins: Secondary | ICD-10-CM | POA: Diagnosis not present

## 2023-10-23 DIAGNOSIS — F5101 Primary insomnia: Secondary | ICD-10-CM

## 2023-10-23 DIAGNOSIS — J439 Emphysema, unspecified: Secondary | ICD-10-CM

## 2023-10-23 DIAGNOSIS — Z0000 Encounter for general adult medical examination without abnormal findings: Secondary | ICD-10-CM | POA: Diagnosis not present

## 2023-10-23 NOTE — Assessment & Plan Note (Signed)
 Intermittent palpitations with episodes of rapid heart rate, sometimes accompanied by lightheadedness. Symptoms are infrequent and have occurred a few times over the past year. Differential diagnosis includes irregular heart rhythm, Stress, anxiety, electrolyte imbalances, and thyroid  dysfunction are potential contributing factors. - Order comprehensive lab work including thyroid  function, electrolytes (sodium, potassium, calcium , magnesium), glucose, kidney function, liver function, protein levels, triglycerides, vitamin D , and B12. - Instruct to monitor symptoms and seek immediate medical attention if palpitations last more than a couple of hours or if rapid heart rate with symptoms like dizziness or shortness of breath lasts more than 30 seconds to a minute. - Consider cardiology referral if lab results are unremarkable and symptoms persist.

## 2023-10-23 NOTE — Assessment & Plan Note (Signed)
 Labs pending for monitoring and r/o cause of palpitations noted.

## 2023-10-23 NOTE — Assessment & Plan Note (Signed)
 Lungs clear with no alarm symptoms present. Continue to monitor closely.

## 2023-10-23 NOTE — Assessment & Plan Note (Signed)
 Blood pressure reading of 150/88 mmHg, slightly improved but still elevated. Stress related to personal circumstances, including husband's hospice care and recent family tragedy, may be contributing to elevated blood pressure. Recommend at home monitoring daily for the next few weeks and sending in the results to ensure that medication is not needed.

## 2023-10-23 NOTE — Assessment & Plan Note (Signed)

## 2023-10-23 NOTE — Progress Notes (Signed)
 [name], DNP, AGNP-c [facility] [address] [place], [place] [address] Main Office [phone] VISIT TYPE: CPE on 10/23/2023 Today's Vitals   10/23/23 0942 10/23/23 0959  BP: (!) 170/92 (!) 150/88  Pulse: 79   Weight: 152 lb 3.2 oz (69 kg)   Height: 5' 5 (1.651 m)    Body mass index is 25.33 kg/m. BP (!) 150/88   Pulse 79   Ht 5' 5 (1.651 m)   Wt 152 lb 3.2 oz (69 kg)   LMP 01/11/2014 (Exact Date)   BMI 25.33 kg/m   Subjective:    Patient ID: Tara Hendrix Raspberry, female    DOB: 1955-10-16, 68 y.o.   MRN: [id number]  HPI: History of Present Illness Tara Hendrix is a 68 year old female who presents for her annual exam. she also has concerns with palpitations and elevated blood pressure.  She experiences episodes of palpitations characterized by a rapid heartbeat, which sometimes resolve with deep breathing but can persist. These episodes occur even at rest, such as in bed. Over the past year, she has occasionally felt lightheaded, describing it as feeling 'a little woozy'. No pain is associated with the palpitations. She denies any weakness, vision changes, LOC with the episodes.   Her blood pressure has been elevated, with a recent reading of 150/88 mmHg. She attributes these fluctuations to stress, as her husband is in hospice care at home due to metastatic lung cancer, and her brother recently committed suicide. She does not check her BP at home, but can do so.   No changes in bowel habits or fullness in her throat.  She uses Ambien  nightly to aid sleep, as she finds it difficult to sleep otherwise.  She denies excessive sleepiness or mood changes associated with daily use.   She is concerned about her nutrition, acknowledging that she hasn't been eating well. Her stress has been an issue with this.   Her husband is in hospice care due to lung cancer that has metastasized to his liver. She is the primary caregiver, managing without  additional help at home, although hospice services are involved. She describes her husband as very thin and not eating much, but still able to perform some daily activities independently. She manages household responsibilities, including gardening, and has support from her ex-brother-in-law and his wife, who are staying at her house to help.  Pertinent items are noted in HPI.  Most Recent Depression Screen:     10/23/2023    9:42 AM 11/28/2022    2:01 PM 10/19/2022    8:28 AM 10/17/2021    8:20 AM 09/12/2021    1:45 PM  Depression screen PHQ 2/9  Decreased Interest 0 0 0 0 0  Down, Depressed, Hopeless 0 0 0 0 0  PHQ - 2 Score 0 0 0 0 0  Altered sleeping  2  3   Tired, decreased energy  0  0   Change in appetite  0  0   Feeling bad or failure about yourself   0  0   Trouble concentrating  0  0   Moving slowly or fidgety/restless  0  1   Suicidal thoughts  0  0   PHQ-9 Score  2  4   Difficult doing work/chores  Not difficult at all  Not difficult at all    Most Recent Anxiety Screen:     10/19/2022    8:28 AM 08/31/2020    8:37 AM  GAD 7 :  Generalized Anxiety Score  Nervous, Anxious, on Edge 1 1  Control/stop worrying 1 1  Worry too much - different things 1 1  Trouble relaxing 3 0  Restless 3 0  Easily annoyed or irritable 2 1  Afraid - awful might happen 1 1  Total GAD 7 Score 12 5  Anxiety Difficulty Somewhat difficult Not difficult at all   Most Recent Fall Screen:    10/23/2023    9:42 AM 11/27/2022    2:36 PM 10/19/2022    8:28 AM 09/26/2022   11:31 AM 10/17/2021    8:20 AM  Fall Risk   Falls in the past year? 0 1 1 0 0  Comment  fell down the steps     Number falls in past yr: 0 0 0 0 0  Injury with Fall? 0 0 0 0 0  Risk for fall due to : No Fall Risks Medication side effect No Fall Risks No Fall Risks No Fall Risks  Follow up Falls evaluation completed Falls prevention discussed;Falls evaluation completed Falls evaluation completed Falls evaluation completed Education  provided;Falls evaluation completed      Data saved with a previous flowsheet row definition    Past medical history, surgical history, medications, allergies, family history and social history reviewed with patient today and changes made to appropriate areas of the chart.  Past Medical History:  Past Medical History:  Diagnosis Date   Allergy    Anemia, iron deficiency    Constipation    Gastric polyps 03/2019   hyperplastic   GERD (gastroesophageal reflux disease)    Hiatal hernia    History of chickenpox    Hx of adenomatous colonic polyps 04/07/2019   Insomnia    Internal hemorrhoids    Kidney stone    Seasonal allergies    Tubular adenoma of colon 09/13/2011   removed from the decending colon   Medications:  Current Outpatient Medications on File Prior to Visit  Medication Sig   ALPRAZolam  (XANAX ) 0.25 MG tablet Take 1 tablet (0.25 mg total) by mouth 2 (two) times daily as needed for anxiety.   Cholecalciferol (VITAMIN D3) 50 MCG (2000 UT) TABS Take 1 tablet by mouth daily.   Misc Natural Products (TURMERIC, CURCUMIN, PO) Take by mouth.   pantoprazole  (PROTONIX ) 40 MG tablet TAKE 1 TABLET BY MOUTH TWICE  DAILY   thiamine (VITAMIN B-1) 100 MG tablet Take 100 mg by mouth daily.   vitamin B-12 (CYANOCOBALAMIN) 100 MCG tablet Take 100 mcg by mouth daily.   zolpidem  (AMBIEN ) 5 MG tablet TAKE ONE TABLET BY MOUTH AT BEDTIME AS NEEDED FOR SLEEP   No current facility-administered medications on file prior to visit.   Surgical History:  Past Surgical History:  Procedure Laterality Date   BREAST BIOPSY Left 2020   BUNIONECTOMY Right 12/15/2011   COLONOSCOPY     COLONOSCOPY W/ BIOPSIES  09/13/2011   ESOPHAGOGASTRODUODENOSCOPY  09/13/2011   TONSILLECTOMY  1964   UPPER GASTROINTESTINAL ENDOSCOPY     Allergies:  No Known Allergies Family History:  Family History  Problem Relation Age of Onset   Heart attack Mother    Hypertension Mother    Lung cancer Mother    Hearing  loss Father    Drug abuse Brother    Cancer Paternal Grandmother        unsure type   Stroke Paternal Grandfather    Intellectual disability Paternal Aunt    Breast cancer Neg Hx    Colon cancer Neg  Hx    Esophageal cancer Neg Hx    Rectal cancer Neg Hx    Stomach cancer Neg Hx        Objective:    BP (!) 150/88   Pulse 79   Ht 5' 5 (1.651 m)   Wt 152 lb 3.2 oz (69 kg)   LMP 01/11/2014 (Exact Date)   BMI 25.33 kg/m   Wt Readings from Last 3 Encounters:  10/23/23 152 lb 3.2 oz (69 kg)  09/17/23 153 lb 9.6 oz (69.7 kg)  01/02/23 142 lb 4 oz (64.5 kg)    Physical Exam Vitals and nursing note reviewed.  Constitutional:      General: She is not in acute distress.    Appearance: Normal appearance.  HENT:     Head: Normocephalic and atraumatic.     Right Ear: Hearing, tympanic membrane, ear canal and external ear normal.     Left Ear: Hearing, tympanic membrane, ear canal and external ear normal.     Nose: Nose normal.     Right Sinus: No maxillary sinus tenderness or frontal sinus tenderness.     Left Sinus: No maxillary sinus tenderness or frontal sinus tenderness.     Mouth/Throat:     Lips: Pink.     Mouth: Mucous membranes are moist.     Pharynx: Oropharynx is clear.  Eyes:     General: Lids are normal. Vision grossly intact.     Extraocular Movements: Extraocular movements intact.     Conjunctiva/sclera: Conjunctivae normal.     Pupils: Pupils are equal, round, and reactive to light.     Funduscopic exam:    Right eye: Red reflex present.        Left eye: Red reflex present.    Visual Fields: Right eye visual fields normal and left eye visual fields normal.  Neck:     Thyroid : No thyromegaly.     Vascular: No carotid bruit.  Cardiovascular:     Rate and Rhythm: Normal rate and regular rhythm.     Chest Wall: PMI is not displaced.     Pulses: Normal pulses.          Dorsalis pedis pulses are 2+ on the right side and 2+ on the left side.       Posterior  tibial pulses are 2+ on the right side and 2+ on the left side.     Heart sounds: Normal heart sounds. No murmur heard. Pulmonary:     Effort: Pulmonary effort is normal. No respiratory distress.     Breath sounds: Normal breath sounds.  Abdominal:     General: Abdomen is flat. Bowel sounds are normal. There is no distension.     Palpations: Abdomen is soft. There is no hepatomegaly, splenomegaly or mass.     Tenderness: There is no abdominal tenderness. There is no right CVA tenderness, left CVA tenderness, guarding or rebound.  Musculoskeletal:        General: Normal range of motion.     Cervical back: Full passive range of motion without pain, normal range of motion and neck supple. No tenderness.     Right lower leg: No edema.     Left lower leg: No edema.  Feet:     Left foot:     Toenail Condition: Left toenails are normal.  Lymphadenopathy:     Cervical: No cervical adenopathy.     Upper Body:     Right upper body: No supraclavicular adenopathy.  Left upper body: No supraclavicular adenopathy.  Skin:    General: Skin is warm and dry.     Capillary Refill: Capillary refill takes less than 2 seconds.     Nails: There is no clubbing.  Neurological:     General: No focal deficit present.     Mental Status: She is alert and oriented to person, place, and time.     GCS: GCS eye subscore is 4. GCS verbal subscore is 5. GCS motor subscore is 6.     Sensory: Sensation is intact.     Motor: Motor function is intact.     Coordination: Coordination is intact.     Gait: Gait is intact.     Deep Tendon Reflexes: Reflexes are normal and symmetric.  Psychiatric:        Attention and Perception: Attention normal.        Mood and Affect: Mood normal.        Speech: Speech normal.        Behavior: Behavior normal. Behavior is cooperative.        Thought Content: Thought content normal.        Cognition and Memory: Cognition and memory normal.        Judgment: Judgment normal.      Results for orders placed or performed in visit on 09/17/23  Cytology - PAP( [place])   Collection Time: 09/17/23  8:47 AM  Result Value Ref Range   Adequacy      Satisfactory for evaluation; transformation zone component ABSENT.   Diagnosis      - Negative for intraepithelial lesion or malignancy (NILM)   Microorganisms Shift in flora suggestive of bacterial vaginosis        Assessment & Plan:   Problem List Items Addressed This Visit     Elevated blood-pressure reading without diagnosis of hypertension   Blood pressure reading of 150/88 mmHg, slightly improved but still elevated. Stress related to personal circumstances, including husband's hospice care and recent family tragedy, may be contributing to elevated blood pressure. Recommend at home monitoring daily for the next few weeks and sending in the results to ensure that medication is not needed.        Relevant Orders   CBC with Differential/Platelet   CMP14+EGFR   Hemoglobin A1c   Lipid panel   Iron, TIBC and Ferritin Panel   Primary insomnia   Chronic insomnia managed with Ambien . She reports using Ambien  nightly to aid sleep. Will plan to continue at this time. Can consider reduction once stressors are improved given age limitations for ongoing use.  - Continue Ambien  for insomnia management.       Encounter for annual physical exam - Primary   CPE completed today. Review of HM activities and recommendations discussed and provided on AVS. Anticipatory guidance, diet, and exercise recommendations provided. Medications, allergies, and hx reviewed and updated as necessary. Orders placed as listed below.  Plan: - Labs ordered. Will make changes as necessary based on results.  - I will review these results and send recommendations via MyChart or a telephone call.  - F/U with CPE in 1 year or sooner for acute/chronic health needs as directed.        Iron deficiency anemia   Labs pending for monitoring and  r/o cause of palpitations noted.       Relevant Orders   CBC with Differential/Platelet   Iron, TIBC and Ferritin Panel   Aortic atherosclerosis (HCC)   Chronic. Lipids  monitored. Low fat diet and exercise recommended.       Relevant Orders   CBC with Differential/Platelet   CMP14+EGFR   Hemoglobin A1c   Lipid panel   Pulmonary emphysema (HCC)   Lungs clear with no alarm symptoms present. Continue to monitor closely.      Relevant Orders   CBC with Differential/Platelet   CMP14+EGFR   Intermittent palpitations   Intermittent palpitations with episodes of rapid heart rate, sometimes accompanied by lightheadedness. Symptoms are infrequent and have occurred a few times over the past year. Differential diagnosis includes irregular heart rhythm, Stress, anxiety, electrolyte imbalances, and thyroid  dysfunction are potential contributing factors. - Order comprehensive lab work including thyroid  function, electrolytes (sodium, potassium, calcium , magnesium), glucose, kidney function, liver function, protein levels, triglycerides, vitamin D , and B12. - Instruct to monitor symptoms and seek immediate medical attention if palpitations last more than a couple of hours or if rapid heart rate with symptoms like dizziness or shortness of breath lasts more than 30 seconds to a minute. - Consider cardiology referral if lab results are unremarkable and symptoms persist.      Relevant Orders   CBC with Differential/Platelet   CMP14+EGFR   Iron, TIBC and Ferritin Panel   Vitamin B12   Magnesium   TSH + free T4   Other Visit Diagnoses       Vitamin D  deficiency       Relevant Orders   VITAMIN D  25 Hydroxy (Vit-D Deficiency, Fractures)     B12 deficiency       Relevant Orders   Vitamin B12       Follow up plan: No follow-ups on file.  NEXT PREVENTATIVE PHYSICAL DUE IN 1 YEAR.  PATIENT COUNSELING PROVIDED FOR ALL ADULT PATIENTS: A well balanced diet low in saturated fats, cholesterol,  and moderation in carbohydrates.  This can be as simple as monitoring portion sizes and cutting back on sugary beverages such as soda and juice to start with.    Daily water consumption of at least 64 ounces.  Physical activity at least 180 minutes per week.  If just starting out, start 10 minutes a day and work your way up.   This can be as simple as taking the stairs instead of the elevator and walking 2-3 laps around the office  purposefully every day.   STD protection, partner selection, and regular testing if high risk.  Limited consumption of alcoholic beverages if alcohol is consumed. For men, I recommend no more than 14 alcoholic beverages per week, spread out throughout the week (max 2 per day). Avoid binge drinking or consuming large quantities of alcohol in one setting.  Please let me know if you feel you may need help with reduction or quitting alcohol consumption.   Avoidance of nicotine, if used. Please let me know if you feel you may need help with reduction or quitting nicotine use.   Daily mental health attention. This can be in the form of 5 minute daily meditation, prayer, journaling, yoga, reflection, etc.  Purposeful attention to your emotions and mental state can significantly improve your overall wellbeing  and  Health.  Please know that I am here to help you with all of your health care goals and am happy to work with you to find a solution that works best for you.  The greatest advice I have received with any changes in life are to take it one step at a time, that even means if all you can  focus on is the next 60 seconds, then do that and celebrate your victories.  With any changes in life, you will have set backs, and that is OK. The important thing to remember is, if you have a set back, it is not a failure, it is an opportunity to try again! Screening Testing Mammogram Every 1 -2 years based on history and risk factors Starting at age 53 Pap Smear Ages  21-39 every 3 years Ages 50-65 every 5 years with HPV testing More frequent testing may be required based on results and history Colon Cancer Screening Every 1-10 years based on test performed, risk factors, and history Starting at age 73 Bone Density Screening Every 2-10 years based on history Starting at age 56 for women Recommendations for men differ based on medication usage, history, and risk factors AAA Screening One time ultrasound Men 35-29 years old who have every smoked Lung Cancer Screening Low Dose Lung CT every 12 months Age 62-80 years with a 30 pack-year smoking history who still smoke or who have quit within the last 15 years   Screening Labs Routine  Labs: Complete Blood Count (CBC), Complete Metabolic Panel (CMP), Cholesterol (Lipid Panel) Every 6-12 months based on history and medications May be recommended more frequently based on current conditions or previous results Hemoglobin A1c Lab Every 3-12 months based on history and previous results Starting at age 51 or earlier with diagnosis of diabetes, high cholesterol, BMI >26, and/or risk factors Frequent monitoring for patients with diabetes to ensure blood sugar control Thyroid  Panel (TSH) Every 6 months based on history, symptoms, and risk factors May be repeated more often if on medication HIV One time testing for all patients 36 and older May be repeated more frequently for patients with increased risk factors or exposure Hepatitis C One time testing for all patients 48 and older May be repeated more frequently for patients with increased risk factors or exposure Gonorrhea, Chlamydia Every 12 months for all sexually active persons 13-24 years Additional monitoring may be recommended for those who are considered high risk or who have symptoms Every 12 months for any woman on birth control, regardless of sexual activity PSA Men 32-61 years old with risk factors Additional screening may be recommended  from age 69-69 based on risk factors, symptoms, and history  Vaccine Recommendations Tetanus Booster All adults every 10 years Flu Vaccine All patients 6 months and older every year COVID Vaccine All patients 12 years and older Initial dosing with booster May recommend additional booster based on age and health history HPV Vaccine 2 doses all patients age 23-26 Dosing may be considered for patients over 26 Shingles Vaccine (Shingrix ) 2 doses all adults 55 years and older Pneumonia (Pneumovax 41) All adults 65 years and older May recommend earlier dosing based on health history One year apart from Prevnar 21 Pneumonia (Prevnar 60) All adults 65 years and older Dosed 1 year after Pneumovax 23 Pneumonia (Prevnar 20) One time alternative to the two dosing of 13 and 23 For all adults with initial dose of 23, 20 is recommended 1 year later For all adults with initial dose of 13, 23 is still recommended as second option 1 year later

## 2023-10-23 NOTE — Assessment & Plan Note (Signed)
Chronic. Lipids monitored. Low fat diet and exercise recommended.

## 2023-10-23 NOTE — Assessment & Plan Note (Signed)
 Chronic insomnia managed with Ambien . She reports using Ambien  nightly to aid sleep. Will plan to continue at this time. Can consider reduction once stressors are improved given age limitations for ongoing use.  - Continue Ambien  for insomnia management.

## 2023-10-24 ENCOUNTER — Other Ambulatory Visit: Payer: Self-pay

## 2023-10-24 ENCOUNTER — Telehealth: Payer: Self-pay

## 2023-10-24 ENCOUNTER — Ambulatory Visit: Payer: Self-pay | Admitting: Nurse Practitioner

## 2023-10-24 DIAGNOSIS — I700 Atherosclerosis of aorta: Secondary | ICD-10-CM

## 2023-10-24 DIAGNOSIS — E782 Mixed hyperlipidemia: Secondary | ICD-10-CM | POA: Insufficient documentation

## 2023-10-24 LAB — CBC WITH DIFFERENTIAL/PLATELET
Basophils Absolute: 0 x10E3/uL (ref 0.0–0.2)
Basos: 1 %
EOS (ABSOLUTE): 0.1 x10E3/uL (ref 0.0–0.4)
Eos: 2 %
Hematocrit: 43.4 % (ref 34.0–46.6)
Hemoglobin: 14.5 g/dL (ref 11.1–15.9)
Immature Grans (Abs): 0 x10E3/uL (ref 0.0–0.1)
Immature Granulocytes: 0 %
Lymphocytes Absolute: 1.4 x10E3/uL (ref 0.7–3.1)
Lymphs: 23 %
MCH: 33.5 pg — ABNORMAL HIGH (ref 26.6–33.0)
MCHC: 33.4 g/dL (ref 31.5–35.7)
MCV: 100 fL — ABNORMAL HIGH (ref 79–97)
Monocytes Absolute: 0.5 x10E3/uL (ref 0.1–0.9)
Monocytes: 8 %
Neutrophils Absolute: 4.2 x10E3/uL (ref 1.4–7.0)
Neutrophils: 66 %
Platelets: 199 x10E3/uL (ref 150–450)
RBC: 4.33 x10E6/uL (ref 3.77–5.28)
RDW: 11.9 % (ref 11.7–15.4)
WBC: 6.2 x10E3/uL (ref 3.4–10.8)

## 2023-10-24 LAB — IRON,TIBC AND FERRITIN PANEL
Ferritin: 128 ng/mL (ref 15–150)
Iron Saturation: 50 (ref 15–55)
Iron: 170 ug/dL — AB (ref 27–139)
Total Iron Binding Capacity: 341 ug/dL (ref 250–450)
UIBC: 171 ug/dL (ref 118–369)

## 2023-10-24 LAB — CMP14+EGFR
ALT: 18 IU/L (ref 0–32)
AST: 21 IU/L (ref 0–40)
Albumin: 4.7 g/dL (ref 3.9–4.9)
Alkaline Phosphatase: 74 IU/L (ref 44–121)
BUN/Creatinine Ratio: 16 (ref 12–28)
BUN: 12 mg/dL (ref 8–27)
Bilirubin Total: 0.6 mg/dL (ref 0.0–1.2)
CO2: 21 mmol/L (ref 20–29)
Calcium: 9.7 mg/dL (ref 8.7–10.3)
Chloride: 102 mmol/L (ref 96–106)
Creatinine, Ser: 0.73 mg/dL (ref 0.57–1.00)
Globulin, Total: 2.1 g/dL (ref 1.5–4.5)
Glucose: 115 mg/dL — ABNORMAL HIGH (ref 70–99)
Potassium: 4.1 mmol/L (ref 3.5–5.2)
Sodium: 141 mmol/L (ref 134–144)
Total Protein: 6.8 g/dL (ref 6.0–8.5)
eGFR: 90 mL/min/1.73 (ref >59)

## 2023-10-24 LAB — TSH+FREE T4
Free T4: 1.13 ng/dL (ref 0.82–1.77)
TSH: 0.952 u[IU]/mL (ref 0.450–4.500)

## 2023-10-24 LAB — HEMOGLOBIN A1C
Est. average glucose Bld gHb Est-mCnc: 114 mg/dL
Hgb A1c MFr Bld: 5.6 % (ref 4.8–5.6)

## 2023-10-24 LAB — LIPID PANEL
Chol/HDL Ratio: 2.9 ratio (ref 0.0–4.4)
Cholesterol, Total: 297 mg/dL — ABNORMAL HIGH (ref 100–199)
HDL: 103 mg/dL (ref >39)
LDL Chol Calc (NIH): 180 mg/dL — ABNORMAL HIGH (ref 0–99)
Triglycerides: 88 mg/dL (ref 0–149)
VLDL Cholesterol Cal: 14 mg/dL (ref 5–40)

## 2023-10-24 LAB — VITAMIN B12: Vitamin B-12: 1021 pg/mL (ref 232–1245)

## 2023-10-24 LAB — VITAMIN D 25 HYDROXY (VIT D DEFICIENCY, FRACTURES): Vit D, 25-Hydroxy: 38 ng/mL (ref 30.0–100.0)

## 2023-10-24 LAB — MAGNESIUM: Magnesium: 1.7 mg/dL (ref 1.6–2.3)

## 2023-10-24 MED ORDER — ATORVASTATIN CALCIUM 10 MG PO TABS: 10.0000 mg | ORAL_TABLET | ORAL | 1 refills | Status: DC

## 2023-10-24 NOTE — Telephone Encounter (Unsigned)
 Copied from CRM [id number]. Topic: Clinical - Request for Lab/Test Order >> Oct 24, 2023 12:27 PM Ismael A wrote: Reason for CRM: patient called in stating she got a reminder on Mychart for Pulmonary CT scan, she is requesting orders to have this scheduled

## 2023-10-24 NOTE — Telephone Encounter (Signed)
 This is a lung cancer screening ct due 12/28/23

## 2023-10-25 NOTE — Telephone Encounter (Signed)
 Spoke to the patient and made the appt

## 2023-12-04 ENCOUNTER — Inpatient Hospital Stay: Attending: Oncology | Admitting: Oncology

## 2023-12-04 ENCOUNTER — Inpatient Hospital Stay

## 2023-12-04 ENCOUNTER — Encounter: Payer: Self-pay | Admitting: Oncology

## 2023-12-04 VITALS — BP 144/74 | HR 70 | Temp 98.0°F | Resp 18 | Ht 65.0 in | Wt 157.3 lb

## 2023-12-04 DIAGNOSIS — R790 Abnormal level of blood mineral: Secondary | ICD-10-CM | POA: Diagnosis not present

## 2023-12-04 DIAGNOSIS — R030 Elevated blood-pressure reading, without diagnosis of hypertension: Secondary | ICD-10-CM

## 2023-12-04 DIAGNOSIS — K635 Polyp of colon: Secondary | ICD-10-CM | POA: Diagnosis not present

## 2023-12-04 LAB — CMP (CANCER CENTER ONLY)
ALT: 17 U/L (ref 0–44)
AST: 20 U/L (ref 15–41)
Albumin: 4.6 g/dL (ref 3.5–5.0)
Alkaline Phosphatase: 70 U/L (ref 38–126)
Anion gap: 11 (ref 5–15)
BUN: 13 mg/dL (ref 8–23)
CO2: 25 mmol/L (ref 22–32)
Calcium: 10.2 mg/dL (ref 8.9–10.3)
Chloride: 104 mmol/L (ref 98–111)
Creatinine: 0.83 mg/dL (ref 0.44–1.00)
GFR, Estimated: >60 mL/min (ref >60)
Glucose, Bld: 109 mg/dL — ABNORMAL HIGH (ref 70–99)
Potassium: 4.4 mmol/L (ref 3.5–5.1)
Sodium: 140 mmol/L (ref 135–145)
Total Bilirubin: 0.4 mg/dL (ref 0.0–1.2)
Total Protein: 7.1 g/dL (ref 6.5–8.1)

## 2023-12-04 LAB — CBC WITH DIFFERENTIAL (CANCER CENTER ONLY)
Abs Immature Granulocytes: 0.02 K/uL (ref 0.00–0.07)
Basophils Absolute: 0 K/uL (ref 0.0–0.1)
Basophils Relative: 1 %
Eosinophils Absolute: 0.1 K/uL (ref 0.0–0.5)
Eosinophils Relative: 1 %
HCT: 38.7 % (ref 36.0–46.0)
Hemoglobin: 13.2 g/dL (ref 12.0–15.0)
Immature Granulocytes: 0 %
Lymphocytes Relative: 24 %
Lymphs Abs: 1.7 K/uL (ref 0.7–4.0)
MCH: 33.3 pg (ref 26.0–34.0)
MCHC: 34.1 g/dL (ref 30.0–36.0)
MCV: 97.7 fL (ref 80.0–100.0)
Monocytes Absolute: 0.5 K/uL (ref 0.1–1.0)
Monocytes Relative: 7 %
Neutro Abs: 4.7 K/uL (ref 1.7–7.7)
Neutrophils Relative %: 67 %
Platelet Count: 182 K/uL (ref 150–400)
RBC: 3.96 MIL/uL (ref 3.87–5.11)
RDW: 12.4 % (ref 11.5–15.5)
WBC Count: 7.1 K/uL (ref 4.0–10.5)
nRBC: 0 % (ref 0.0–0.2)

## 2023-12-04 LAB — IRON AND TIBC
Iron: 124 ug/dL (ref 28–170)
Saturation Ratios: 30 % (ref 10.4–31.8)
TIBC: 414 ug/dL (ref 250–450)
UIBC: 290 ug/dL

## 2023-12-04 LAB — C-REACTIVE PROTEIN: CRP: 0.6 mg/dL (ref <1.0)

## 2023-12-04 LAB — FERRITIN: Ferritin: 113 ng/mL (ref 11–307)

## 2023-12-04 LAB — SEDIMENTATION RATE: Sed Rate: 4 mm/h (ref 0–22)

## 2023-12-04 NOTE — Assessment & Plan Note (Signed)
 Blood pressure readings have been variable with recent elevations. She has not used antihypertensive medication. Previous visits showed elevated blood pressure, though it was normal in past years. Home monitoring is recommended to assess medication need. - Instruct to monitor blood pressure at home twice daily (AM and PM) for 1 to 2 weeks - Advise to contact primary care provider with home blood pressure readings to determine need for medication

## 2023-12-04 NOTE — Assessment & Plan Note (Signed)
 Labs at her PCPs office on 10/23/2023 showed increased iron of 170 mcg/dL.  Iron saturation was borderline increased at 50%, ferritin 128.  Previously labs in August 2024 also showed increased iron of 188 mcg/dL, ferritin at 181 ng/mL and iron saturation was increased at 52%.  Given these findings, referral was sent to us  for further evaluation.  Ferritin levels are normal this year, with a slight elevation last year. Liver and thyroid  functions are normal, indicating no organ damage. Differential diagnosis includes hemochromatosis, to be evaluated with genetic testing. Stress from her husband's hospice care may contribute to some of the lab abnormalities.  We will proceed with HFE mutation analysis to rule out hemochromatosis.  CBCD is unremarkable today.  CMP unremarkable with normal LFTs.  Ferritin normal at 113.  Sed rate normal at 4 mm/h.  CRP pending.  - Schedule follow-up phone call in 2 weeks to discuss test results  - Re-evaluate iron levels in 4 months  Overall clinical picture is not indicative of hemochromatosis.  I do not suspect that she will need therapeutic phlebotomy or other interventions from our standpoint.  She was provided reassurance.

## 2023-12-04 NOTE — Progress Notes (Signed)
 [facility]  HEMATOLOGY CLINIC CONSULTATION NOTE   PATIENT NAME: Tara Hendrix   MR#: [id number] DOB: [birth year]  DATE OF SERVICE: 12/04/2023   REFERRING PROVIDER  Early, Camie BRAVO, NP   Patient Care Team: Early, Camie BRAVO, NP as PCP - General (Nurse Practitioner) Cleotilde Ronal RAMAN, MD as Consulting Physician (Gynecology) Livingston Rigg, MD as Consulting Physician (Dermatology)   REASON FOR CONSULTATION/ CHIEF COMPLAINT: Increased serum iron  ASSESSMENT & PLAN:  Tara Hendrix is a 68 y.o. lady with a past medical history of hypertension, GERD, renal calculi, colon polyps was referred to our service for evaluation of increased serum iron.    Raised serum iron Labs at her PCPs office on 10/23/2023 showed increased iron of 170 mcg/dL.  Iron saturation was borderline increased at 50%, ferritin 128.  Previously labs in August 2024 also showed increased iron of 188 mcg/dL, ferritin at 181 ng/mL and iron saturation was increased at 52%.  Given these findings, referral was sent to us  for further evaluation.  Ferritin levels are normal this year, with a slight elevation last year. Liver and thyroid  functions are normal, indicating no organ damage. Differential diagnosis includes hemochromatosis, to be evaluated with genetic testing. Stress from her husband's hospice care may contribute to some of the lab abnormalities.  We will proceed with HFE mutation analysis to rule out hemochromatosis.  CBCD is unremarkable today.  CMP unremarkable with normal LFTs.  Ferritin normal at 113.  Sed rate normal at 4 mm/h.  CRP pending.  - Schedule follow-up phone call in 2 weeks to discuss test results  - Re-evaluate iron levels in 4 months  Overall clinical picture is not indicative of hemochromatosis.  I do not suspect that she will need therapeutic phlebotomy or other interventions from our standpoint.  She was provided reassurance.  Elevated blood-pressure reading without  diagnosis of hypertension Blood pressure readings have been variable with recent elevations. She has not used antihypertensive medication. Previous visits showed elevated blood pressure, though it was normal in past years. Home monitoring is recommended to assess medication need. - Instruct to monitor blood pressure at home twice daily (AM and PM) for 1 to 2 weeks - Advise to contact primary care provider with home blood pressure readings to determine need for medication   I reviewed lab results and outside records for this visit and discussed relevant results with the patient. Diagnosis, plan of care and treatment options were also discussed in detail with the patient. Opportunity provided to ask questions and answers provided to her apparent satisfaction. Provided instructions to call our clinic with any problems, questions or concerns prior to return visit. I recommended to continue follow-up with PCP and sub-specialists. She verbalized understanding and agreed with the plan. No barriers to learning was detected.  Chinita Patten, MD  12/04/2023 5:36 PM  [facility] - A DEPT OF [facility] [id number] [place] [address] Dept: [phone] Dept Fax: [phone]   HISTORY OF PRESENT ILLNESS:  Discussed the use of AI scribe software for clinical note transcription with the patient, who gave verbal consent to proceed.  History of Present Illness Tara Hendrix is a 68 year old female who presents with elevated iron levels. She was referred by her primary doctor for evaluation of high iron levels.  Labs at her PCPs office on 10/23/2023 showed increased iron of 170 mcg/dL.  Iron saturation was borderline increased  at 50%, ferritin 128.  Previously labs in August 2024 also showed increased iron of 188 mcg/dL, ferritin at 181 ng/mL and iron saturation was increased at 52%.  Given these findings, referral was sent  to us  for further evaluation.  She does not take iron supplements or consume a diet high in iron, such as red meat or spinach. Her iron saturation is at the upper limit of normal at 50%, with a cutoff of 55%. Ferritin levels were slightly elevated last year but are normal this year. She attributes some of her stress to her husband's ongoing hospice care for cancer since 2021.  She has a history of fluctuating blood pressure readings, ranging from normal to elevated levels, such as 124/80 to 150/85. She has never been on antihypertensive medication. Recent readings have been as high as 186/90, which she describes as 'stroke material', but they often decrease upon retesting. She occasionally checks her blood pressure at home, noting systolic readings around 145 to 150.  She recently began taking Lipitor three times a week (Monday, Wednesday, Friday) due to concerns about her cholesterol levels, despite having high HDL and suboptimal LDL levels. She has a history of being borderline anemic and took high levels of iron supplements a few times in her life, well before menopause.  She denies fever, cough, diarrhea, or other infectious symptoms.  She denies epistaxis, bloody stool, melena, hematuria, bruising or other bleeding symptoms. She also denies unintentional weight loss, night sweats or other constitutional symptoms.  MEDICAL HISTORY Past Medical History:  Diagnosis Date   Allergy    Anemia, iron deficiency    Constipation    Gastric polyps 03/2019   hyperplastic   GERD (gastroesophageal reflux disease)    Hiatal hernia    History of chickenpox    Hx of adenomatous colonic polyps 04/07/2019   Insomnia    Internal hemorrhoids    Kidney stone    Seasonal allergies    Tubular adenoma of colon 09/13/2011   removed from the decending colon     SURGICAL HISTORY Past Surgical History:  Procedure Laterality Date   BREAST BIOPSY Left 2020   BUNIONECTOMY Right 12/15/2011   COLONOSCOPY      COLONOSCOPY W/ BIOPSIES  09/13/2011   ESOPHAGOGASTRODUODENOSCOPY  09/13/2011   TONSILLECTOMY  1964   UPPER GASTROINTESTINAL ENDOSCOPY       SOCIAL HISTORY: She reports that she quit smoking about 5 years ago. Her smoking use included cigarettes. She started smoking about 41 years ago. She has a 18.2 pack-year smoking history. She has never used smokeless tobacco. She reports current alcohol use. She reports that she does not use drugs. Social History   Socioeconomic History   Marital status: Married    Spouse name: Not on file   Number of children: 0   Years of education: Not on file   Highest education level: Not on file  Occupational History   Occupation: Sales  Tobacco Use   Smoking status: Former    Current packs/day: 0.00    Average packs/day: 0.5 packs/day for 36.5 years (18.2 ttl pk-yrs)    Types: Cigarettes    Start date: 02/27/1982    Quit date: 08/28/2018    Years since quitting: 5.2   Smokeless tobacco: Never  Vaping Use   Vaping status: Former  Substance and Sexual Activity   Alcohol use: Yes    Comment: 4 per day   Drug use: No   Sexual activity: Yes    Partners: Male  Birth control/protection: Surgical, Post-menopausal    Comment: vasectomy  Other Topics Concern   Not on file  Social History Narrative   She is married without children   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   3 alcoholic beverages a day   3 caffeinated beverages a day   No drug or tobacco use former smoker   Social Drivers of Corporate investment banker Strain: Low Risk  (10/23/2023)   Overall Financial Resource Strain (CARDIA)    Difficulty of Paying Living Expenses: Not hard at all  Food Insecurity: No Food Insecurity (10/23/2023)   Hunger Vital Sign    Worried About Running Out of Food in the Last Year: Never true    Ran Out of Food in the Last Year: Never true  Transportation Needs: No Transportation Needs (10/23/2023)    PRAPARE - Administrator, Civil Service (Medical): No    Lack of Transportation (Non-Medical): No  Physical Activity: Sufficiently Active (10/23/2023)   Exercise Vital Sign    Days of Exercise per Week: 7 days    Minutes of Exercise per Session: 30 min  Stress: Stress Concern Present (10/23/2023)   [employer] of Occupational Health - Occupational Stress Questionnaire    Feeling of Stress: Very much  Social Connections: Moderately Integrated (10/23/2023)   Social Connection and Isolation Panel    Frequency of Communication with Friends and Family: More than three times a week    Frequency of Social Gatherings with Friends and Family: More than three times a week    Attends Religious Services: Never    Database administrator or Organizations: Yes    Attends Engineer, structural: More than 4 times per year    Marital Status: Married  Catering manager Violence: Not At Risk (10/23/2023)   Humiliation, Afraid, Rape, and Kick questionnaire    Fear of Current or Ex-Partner: No    Emotionally Abused: No    Physically Abused: No    Sexually Abused: No    FAMILY HISTORY: Her family history includes Cancer in her paternal grandmother; Drug abuse in her brother; Hearing loss in her father; Heart attack in her mother; Hypertension in her mother; Intellectual disability in her paternal aunt; Lung cancer in her mother; Stroke in her paternal grandfather.  CURRENT MEDICATIONS   Current Outpatient Medications  Medication Instructions   ALPRAZolam  (XANAX ) 0.25 mg, Oral, 2 times daily PRN   atorvastatin  (LIPITOR) 10 mg, Oral, 3 times weekly   Cholecalciferol (VITAMIN D3) 50 MCG (2000 UT) TABS 1 tablet, Daily   Misc Natural Products (TURMERIC, CURCUMIN, PO) Take by mouth.   pantoprazole  (PROTONIX ) 40 mg, Oral, 2 times daily   thiamine (VITAMIN B-1) 100 mg, Daily   UNABLE TO FIND 1 Scoop, Daily   vitamin B-12 (CYANOCOBALAMIN) 100 mcg, Daily   zolpidem  (AMBIEN ) 5 mg, Oral,  At bedtime PRN, for sleep     ALLERGIES  She has no known allergies.  REVIEW OF SYSTEMS:  Review of Systems - Oncology   Rest of the pertinent review of systems is unremarkable except as mentioned above in HPI.  PHYSICAL EXAMINATION:   Onc Performance Status - 12/04/23 1410       ECOG Perf Status   ECOG Perf Status Fully active, able to carry on all pre-disease performance without restriction      KPS SCALE   KPS % SCORE Normal, no compliants, no evidence of  disease          Vitals:   12/04/23 1401 12/04/23 1404  BP: (!) 173/78 (!) 144/74  Pulse: 70   Resp: 18   Temp: 98 F (36.7 C)   SpO2: 99%    Filed Weights   12/04/23 1401  Weight: 157 lb 4.8 oz (71.4 kg)    Physical Exam Constitutional:      General: She is not in acute distress.    Appearance: Normal appearance.  HENT:     Head: Normocephalic and atraumatic.  Cardiovascular:     Rate and Rhythm: Normal rate.     Heart sounds: Normal heart sounds.  Pulmonary:     Effort: Pulmonary effort is normal. No respiratory distress.     Breath sounds: Normal breath sounds.  Abdominal:     General: There is no distension.  Neurological:     General: No focal deficit present.     Mental Status: She is alert and oriented to person, place, and time.  Psychiatric:        Mood and Affect: Mood normal.        Behavior: Behavior normal.    LABORATORY DATA:   I have reviewed the data as listed.  Results for orders placed or performed in visit on 12/04/23  Sedimentation rate  Result Value Ref Range   Sed Rate 4 0 - 22 mm/hr  Ferritin  Result Value Ref Range   Ferritin 113 11 - 307 ng/mL  CMP (Cancer Center only)  Result Value Ref Range   Sodium 140 135 - 145 mmol/L   Potassium 4.4 3.5 - 5.1 mmol/L   Chloride 104 98 - 111 mmol/L   CO2 25 22 - 32 mmol/L   Glucose, Bld 109 (H) 70 - 99 mg/dL   BUN 13 8 - 23 mg/dL   Creatinine 9.16 9.55 - 1.00 mg/dL   Calcium  10.2 8.9 - 10.3 mg/dL   Total Protein 7.1  6.5 - 8.1 g/dL   Albumin 4.6 3.5 - 5.0 g/dL   AST 20 15 - 41 U/L   ALT 17 0 - 44 U/L   Alkaline Phosphatase 70 38 - 126 U/L   Total Bilirubin 0.4 0.0 - 1.2 mg/dL   GFR, Estimated >39 >39 mL/min   Anion gap 11 5 - 15  CBC with Differential (Cancer Center Only)  Result Value Ref Range   WBC Count 7.1 4.0 - 10.5 K/uL   RBC 3.96 3.87 - 5.11 MIL/uL   Hemoglobin 13.2 12.0 - 15.0 g/dL   HCT 61.2 63.9 - 53.9 %   MCV 97.7 80.0 - 100.0 fL   MCH 33.3 26.0 - 34.0 pg   MCHC 34.1 30.0 - 36.0 g/dL   RDW 87.5 88.4 - 84.4 %   Platelet Count 182 150 - 400 K/uL   nRBC 0.0 0.0 - 0.2 %   Neutrophils Relative % 67 %   Neutro Abs 4.7 1.7 - 7.7 K/uL   Lymphocytes Relative 24 %   Lymphs Abs 1.7 0.7 - 4.0 K/uL   Monocytes Relative 7 %   Monocytes Absolute 0.5 0.1 - 1.0 K/uL   Eosinophils Relative 1 %   Eosinophils Absolute 0.1 0.0 - 0.5 K/uL   Basophils Relative 1 %   Basophils Absolute 0.0 0.0 - 0.1 K/uL   Immature Granulocytes 0 %   Abs Immature Granulocytes 0.02 0.00 - 0.07 K/uL     RADIOGRAPHIC STUDIES:  No pertinent imaging studies available to review.  Orders Placed  This Encounter  Procedures   Hemochromatosis DNA, PCR    Standing Status:   Future    Number of Occurrences:   1    Expiration Date:   12/03/2024   CBC with Differential (Cancer Center Only)    Standing Status:   Future    Number of Occurrences:   1    Expiration Date:   12/03/2024   CMP (Cancer Center only)    Standing Status:   Future    Number of Occurrences:   1    Expiration Date:   12/03/2024   Iron and TIBC    Standing Status:   Future    Number of Occurrences:   1    Expiration Date:   12/03/2024   Ferritin    Standing Status:   Future    Number of Occurrences:   1    Expiration Date:   12/03/2024   Sedimentation rate    Standing Status:   Future    Number of Occurrences:   1    Expiration Date:   12/03/2024   C-reactive protein    Standing Status:   Future    Number of Occurrences:   1    Expiration  Date:   12/03/2024    Future Appointments  Date Time Provider Department Center  12/18/2023  3:45 PM Amberia Bayless, Chinita, MD [facility] None  12/20/2023  2:10 PM [facility]-ANNUAL WELLNESS VISIT [facility] [address]  12/31/2023  9:00 AM [facility] 1 [facility]  01/17/2024  8:30 AM PFM-LAB [facility] [address]  04/07/2024  2:15 PM [facility] PHLEBOTOMIST [facility] None  04/07/2024  2:30 PM Madalyn Legner, Chinita, MD [facility] None  11/11/2024  8:30 AM Early, Camie BRAVO, NP [facility] [address]  09/18/2025  8:15 AM Cleotilde Ronal RAMAN, MD [facility] [phone] [address]    I spent a total of 55 minutes during this encounter with the patient including review of chart and various tests results, discussions about plan of care and coordination of care plan.  This document was completed utilizing speech recognition software. Grammatical errors, random word insertions, pronoun errors, and incomplete sentences are an occasional consequence of this system due to software limitations, ambient noise, and hardware issues. Any formal questions or concerns about the content, text or information contained within the body of this dictation should be directly addressed to the provider for clarification.

## 2023-12-07 LAB — HEMOCHROMATOSIS DNA-PCR(C282Y,H63D)

## 2023-12-18 ENCOUNTER — Telehealth: Payer: Self-pay

## 2023-12-18 ENCOUNTER — Inpatient Hospital Stay: Admitting: Oncology

## 2023-12-18 DIAGNOSIS — R790 Abnormal level of blood mineral: Secondary | ICD-10-CM

## 2023-12-18 NOTE — Telephone Encounter (Signed)
 Called and left a voicemail regarding the need to reschedule her phone appointment.

## 2023-12-18 NOTE — Progress Notes (Deleted)
 [facility]  HEMATOLOGY-ONCOLOGY ELECTRONIC VISIT PROGRESS NOTE  PATIENT NAME: Tara Hendrix   MR#: [id number] DOB: 04/06/1955  DATE OF SERVICE: 12/18/2023  Patient Care Team: Early, Camie BRAVO, NP as PCP - General (Nurse Practitioner) Cleotilde Ronal RAMAN, MD as Consulting Physician (Gynecology) Livingston Rigg, MD as Consulting Physician (Dermatology)  I connected with the patient via telephone conference and verified that I am speaking with the correct person using two identifiers. The patient's location is at home and I am providing care from the [facility].  I discussed the limitations, risks, security and privacy concerns of performing an evaluation and management service by e-visits and the availability of in person appointments. I also discussed with the patient that there may be a patient responsible charge related to this service. The patient expressed understanding and agreed to proceed.   ASSESSMENT & PLAN:   Macaria Bias is a 68 y.o. ***  No problem-specific Assessment & Plan notes found for this encounter.   Assessment and Plan Assessment & Plan       I discussed the assessment and treatment plan with the patient. The patient was provided an opportunity to ask questions and all were answered. The patient agreed with the plan and demonstrated an understanding of the instructions. The patient was advised to call back or seek an in-person evaluation if the symptoms worsen or if the condition fails to improve as anticipated.    I spent *** minutes over the phone with the patient reviewing test results, discuss management and coordination/planning of care.  Chinita Patten, MD 12/18/2023 9:03 PM [facility] - A DEPT OF [facility] [address] [place] [address] Dept: [phone] Dept Fax: [phone]   INTERVAL HISTORY:  Please see above for problem oriented  charting.  The purpose of today's discussion is to explain recent lab results and to formulate plan of care.  Discussed the use of AI scribe software for clinical note transcription with the patient, who gave verbal consent to proceed.  History of Present Illness      ***  SUMMARY OF ONCOLOGIC HISTORY *** HEMATOLOGY HISTORY:  Oncology History   No history exists.    REVIEW OF SYSTEMS:    Review of Systems - Oncology  All other pertinent systems were reviewed with the patient and are negative.  I have reviewed the past medical history, past surgical history, social history and family history with the patient and they are unchanged from previous note.  ALLERGIES:  She has no known allergies.  MEDICATIONS:  Current Outpatient Medications  Medication Sig Dispense Refill   ALPRAZolam  (XANAX ) 0.25 MG tablet Take 1 tablet (0.25 mg total) by mouth 2 (two) times daily as needed for anxiety. 20 tablet 0   atorvastatin  (LIPITOR) 10 MG tablet Take 1 tablet (10 mg total) by mouth 3 (three) times a week. 36 tablet 1   Cholecalciferol (VITAMIN D3) 50 MCG (2000 UT) TABS Take 1 tablet by mouth daily.     Misc Natural Products (TURMERIC, CURCUMIN, PO) Take by mouth.     pantoprazole  (PROTONIX ) 40 MG tablet TAKE 1 TABLET BY MOUTH TWICE  DAILY 200 tablet 2   thiamine (VITAMIN B-1) 100 MG tablet Take 100 mg by mouth daily.     UNABLE TO FIND Take 1 Scoop by mouth daily. Med Name: Total Beets (Balance of Nature)     vitamin B-12 (CYANOCOBALAMIN) 100 MCG tablet Take 100  mcg by mouth daily.     zolpidem  (AMBIEN ) 5 MG tablet TAKE ONE TABLET BY MOUTH AT BEDTIME AS NEEDED FOR SLEEP 30 tablet 3   No current facility-administered medications for this visit.    PHYSICAL EXAMINATION:  Not performed today as it was a phone only visit  LABORATORY DATA:   I have reviewed the data as listed.  Recent Results (from the past 2160 hours)  CBC with Differential/Platelet     Status: Abnormal    Collection Time: 10/23/23 11:50 AM  Result Value Ref Range   WBC 6.2 3.4 - 10.8 x10E3/uL   RBC 4.33 3.77 - 5.28 x10E6/uL   Hemoglobin 14.5 11.1 - 15.9 g/dL   Hematocrit 56.5 65.9 - 46.6 %   MCV 100 (H) 79 - 97 fL   MCH 33.5 (H) 26.6 - 33.0 pg   MCHC 33.4 31.5 - 35.7 g/dL   RDW 88.0 88.2 - 84.5 %   Platelets 199 150 - 450 x10E3/uL   Neutrophils 66 Not Estab. %   Lymphs 23 Not Estab. %   Monocytes 8 Not Estab. %   Eos 2 Not Estab. %   Basos 1 Not Estab. %   Neutrophils Absolute 4.2 1.4 - 7.0 x10E3/uL   Lymphocytes Absolute 1.4 0.7 - 3.1 x10E3/uL   Monocytes Absolute 0.5 0.1 - 0.9 x10E3/uL   EOS (ABSOLUTE) 0.1 0.0 - 0.4 x10E3/uL   Basophils Absolute 0.0 0.0 - 0.2 x10E3/uL   Immature Granulocytes 0 Not Estab. %   Immature Grans (Abs) 0.0 0.0 - 0.1 x10E3/uL  CMP14+EGFR     Status: Abnormal   Collection Time: 10/23/23 11:50 AM  Result Value Ref Range   Glucose 115 (H) 70 - 99 mg/dL   BUN 12 8 - 27 mg/dL   Creatinine, Ser 9.26 0.57 - 1.00 mg/dL   eGFR 90 >40 fO/fpw/8.26   BUN/Creatinine Ratio 16 12 - 28   Sodium 141 134 - 144 mmol/L   Potassium 4.1 3.5 - 5.2 mmol/L   Chloride 102 96 - 106 mmol/L   CO2 21 20 - 29 mmol/L   Calcium  9.7 8.7 - 10.3 mg/dL   Total Protein 6.8 6.0 - 8.5 g/dL   Albumin 4.7 3.9 - 4.9 g/dL   Globulin, Total 2.1 1.5 - 4.5 g/dL   Bilirubin Total 0.6 0.0 - 1.2 mg/dL   Alkaline Phosphatase 74 44 - 121 IU/L   AST 21 0 - 40 IU/L   ALT 18 0 - 32 IU/L  Hemoglobin A1c     Status: None   Collection Time: 10/23/23 11:50 AM  Result Value Ref Range   Hgb A1c MFr Bld 5.6 4.8 - 5.6 %    Comment:          Prediabetes: 5.7 - 6.4          Diabetes: >6.4          Glycemic control for adults with diabetes: <7.0    Est. average glucose Bld gHb Est-mCnc 114 mg/dL  Lipid panel     Status: Abnormal   Collection Time: 10/23/23 11:50 AM  Result Value Ref Range   Cholesterol, Total 297 (H) 100 - 199 mg/dL   Triglycerides 88 0 - 149 mg/dL   HDL 896 >60 mg/dL   VLDL  Cholesterol Cal 14 5 - 40 mg/dL   LDL Chol Calc (NIH) 819 (H) 0 - 99 mg/dL   LDL CALC COMMENT: Comment     Comment: Consider evaluating for Familial Hypercholesterolemia(FH), if clinically  indicated.    Chol/HDL Ratio 2.9 0.0 - 4.4 ratio    Comment:                                   T. Chol/HDL Ratio                                             Men  Women                               1/2 Avg.Risk  3.4    3.3                                   Avg.Risk  5.0    4.4                                2X Avg.Risk  9.6    7.1                                3X Avg.Risk 23.4   11.0   Iron, TIBC and Ferritin Panel     Status: Abnormal   Collection Time: 10/23/23 11:50 AM  Result Value Ref Range   Total Iron Binding Capacity 341 250 - 450 ug/dL   UIBC 828 881 - 630 ug/dL   Iron 829 (H) 27 - 860 ug/dL   Iron Saturation 50 15 - 55 %   Ferritin 128 15 - 150 ng/mL  Vitamin B12     Status: None   Collection Time: 10/23/23 11:50 AM  Result Value Ref Range   Vitamin B-12 1,021 232 - 1,245 pg/mL  VITAMIN D  25 Hydroxy (Vit-D Deficiency, Fractures)     Status: None   Collection Time: 10/23/23 11:50 AM  Result Value Ref Range   Vit D, 25-Hydroxy 38.0 30.0 - 100.0 ng/mL    Comment: Vitamin D  deficiency has been defined by the Institute of Medicine and an Endocrine Society practice guideline as a level of serum 25-OH vitamin D  less than 20 ng/mL (1,2). The Endocrine Society went on to further define vitamin D  insufficiency as a level between 21 and 29 ng/mL (2). 1. IOM (Institute of Medicine). 2010. Dietary reference    intakes for calcium  and D. [place]: The    [employer]. 2. Holick MF, [place]inkley [place], Bischoff-Ferrari HA, et al.    Evaluation, treatment, and prevention of vitamin D     deficiency: an Endocrine Society clinical practice    guideline. JCEM. 2011 Jul; 96(7):1911-30.   Magnesium     Status: None   Collection Time: 10/23/23 11:50 AM  Result Value Ref Range   Magnesium  1.7 1.6 - 2.3 mg/dL  TSH + free T4     Status: None   Collection Time: 10/23/23 11:50 AM  Result Value Ref Range   TSH 0.952 0.450 - 4.500 uIU/mL   Free T4 1.13 0.82 - 1.77 ng/dL  C-reactive protein     Status: None   Collection Time: 12/04/23  2:37 PM  Result Value Ref Range   CRP  0.6 <1.0 mg/dL    Comment: Performed at [facility], [address], [place], [place] [address]  Sedimentation rate     Status: None   Collection Time: 12/04/23  2:37 PM  Result Value Ref Range   Sed Rate 4 0 - 22 mm/hr    Comment: Performed at [facility], [address] [place], [place] [address]  Iron and TIBC     Status: None   Collection Time: 12/04/23  2:37 PM  Result Value Ref Range   Iron 124 28 - 170 ug/dL   TIBC 585 749 - 549 ug/dL   Saturation Ratios 30 10.4 - 31.8 %   UIBC 290 ug/dL    Comment: Performed at [facility], [address], [place], [place] [address]  CMP (Cancer Center only)     Status: Abnormal   Collection Time: 12/04/23  2:37 PM  Result Value Ref Range   Sodium 140 135 - 145 mmol/L   Potassium 4.4 3.5 - 5.1 mmol/L   Chloride 104 98 - 111 mmol/L   CO2 25 22 - 32 mmol/L   Glucose, Bld 109 (H) 70 - 99 mg/dL    Comment: Glucose reference range applies only to samples taken after fasting for at least 8 hours.   BUN 13 8 - 23 mg/dL   Creatinine 9.16 9.55 - 1.00 mg/dL   Calcium  10.2 8.9 - 10.3 mg/dL   Total Protein 7.1 6.5 - 8.1 g/dL   Albumin 4.6 3.5 - 5.0 g/dL   AST 20 15 - 41 U/L   ALT 17 0 - 44 U/L   Alkaline Phosphatase 70 38 - 126 U/L   Total Bilirubin 0.4 0.0 - 1.2 mg/dL   GFR, Estimated >39 >39 mL/min    Comment: (NOTE) Calculated using the CKD-EPI Creatinine Equation (2021)    Anion gap 11 5 - 15    Comment: Performed at [facility], [address] [place], [place] [address]  CBC with Differential (Cancer Center Only)     Status: None   Collection Time: 12/04/23  2:37 PM  Result Value Ref Range   WBC  Count 7.1 4.0 - 10.5 K/uL   RBC 3.96 3.87 - 5.11 MIL/uL   Hemoglobin 13.2 12.0 - 15.0 g/dL   HCT 61.2 63.9 - 53.9 %   MCV 97.7 80.0 - 100.0 fL   MCH 33.3 26.0 - 34.0 pg   MCHC 34.1 30.0 - 36.0 g/dL   RDW 87.5 88.4 - 84.4 %   Platelet Count 182 150 - 400 K/uL   nRBC 0.0 0.0 - 0.2 %   Neutrophils Relative % 67 %   Neutro Abs 4.7 1.7 - 7.7 K/uL   Lymphocytes Relative 24 %   Lymphs Abs 1.7 0.7 - 4.0 K/uL   Monocytes Relative 7 %   Monocytes Absolute 0.5 0.1 - 1.0 K/uL   Eosinophils Relative 1 %   Eosinophils Absolute 0.1 0.0 - 0.5 K/uL   Basophils Relative 1 %   Basophils Absolute 0.0 0.0 - 0.1 K/uL   Immature Granulocytes 0 %   Abs Immature Granulocytes 0.02 0.00 - 0.07 K/uL    Comment: Performed at [facility], [address], [place], [place] [address]  Hemochromatosis DNA, PCR     Status: None   Collection Time: 12/04/23  2:37 PM  Result Value Ref Range   DNA Mutation Analysis Comment     Comment: (NOTE) Results: c.845G>A (p.Cys282Tyr) - Not Detected c.187C>G (  p.His63Asp) - Detected, heterozygous c.193A>T (p.Ser65Cys) - Not Detected Not associated with increased risk to develop clinical symptoms of Hereditary Hemochromatosis. In symptomatic individuals, other causes of iron overload should be evaluated. See Additional Information and Comments. Additional Clinical Information: Hereditary hemochromatosis (HFE related) is an autosomal recessive iron storage disorder. Patients may have a genetic diagnosis of hereditary hemochromatosis and never show clinical symptoms. Clinical symptoms typically appear between 40 to 60 years in males and after menopause in females. Signs and symptoms may include organ damage, primarily in the liver, risk for hepatocellular carcinoma, diabetes, and heart disease due to iron accumulation. Life expectancy may be decreased in individuals who develop cirrhosis. Treatment for clinically symptomatic individuals may include  therapeutic ph lebotomy. Liver transplant may be used to treat end stage liver failure. For preventive care, monitoring for iron overload is recommended for patients who are homozygous for c.845G>A (p.Cys282Tyr) and have yet to experience clinical symptoms. Comments: The most common HFE variants associated with hereditary hemochromatosis are c.845G>A (p.Cys282Tyr), c.187C>G (p.His63Asp), c.193A>T (p.Ser65Cys). While patients homozygous for c.845G>A (p.Cys282Tyr) are the most likely to present clinical symptoms, less than 10% develop clinically significant iron overload with tissue and organ damage. Genetic counseling is recommended to discuss the potential clinical implications of positive results, as well as recommendations for testing family members. Genetic Coordinators are available for health care providers to discuss results at [phone]). Test Details: Three variants analyzed: c.845G>A (p.Cys282Tyr), commonly referred to as C282Y c.187C>G (p.His63Asp), commonly re ferred to as H63D c.193A>T (p.Ser65Cys), commonly referred to as S65C Methods/Limitations: DNA Analysis of the HFE gene (NM_[id number]) was performed by PCR amplification followed by restriction enzyme digestion analyses. Results must be combined with clinical information for the most accurate interpretation. Molecular-based testing is highly accurate, but as in any laboratory test, diagnostic errors may occur. False positive or false negative results may occur for reasons that include genetic variants, blood transfusions, bone marrow transplantation, somatic or tissue-specific mosaicism, mislabeled samples, or erroneous representation of family relationships. This test was developed and its performance characteristics determined by [facility]. It has not been cleared or approved by the Food and Drug Administration. References: Aldona CAVES, [address] [place]owdley SONNA Monte LW, [place] AS; American Association for  the Study of Liver Diseases. Diagnosis and management of hemochromat osis: 2011 practice guideline by the American Association for the Study of Liver Diseases. Hepatology. 2011 Jul;54(1):328-43. doi: 10.1002/hep.24330. PMID: 78547709; PMCID: EFR6850874. [address], Brissot P, Swinkels DW, Zoller H, Kamarainen O, Patton S, Alonso I, Morris M, Keeney S. EMQN best practice guidelines for the molecular genetic diagnosis of hereditary hemochromatosis [facility]). Eur J Hum Genet. 2016 Apr;24(4):479-95. doi: 10.1038/ejhg.2015.128. Epub 2015 Jul 8. PMID: 73846781; PMCID: EFR5070138.    Reviewed by: Comment     Comment: (NOTE) Technical Component performed at [employer] [facility] Professional Component performed by: W. Wanda Norse, PhD, [facility], [facility], [address] [place] [address] Performed At: [facility] [address] [place], [place] [id number] Loran Gales MDPhD Ey:[phone]   Ferritin     Status: None   Collection Time: 12/04/23  2:38 PM  Result Value Ref Range   Ferritin 113 11 - 307 ng/mL    Comment: Performed at [facility], [address] [place], [place] [address]     RADIOGRAPHIC STUDIES:  I have personally reviewed the radiological images as listed and agree with the findings in the report.  No results found.  *** No recent pertinent imaging studies available to review.  No  orders of the defined types were placed in this encounter.    Future Appointments  Date Time Provider Department Center  12/20/2023  2:10 PM PFM-ANNUAL WELLNESS VISIT [facility] [address]  12/31/2023  9:00 AM [facility] 1 [facility]  01/17/2024  8:30 AM PFM-LAB [facility] [address]  04/07/2024  2:15 PM [facility]-MEDONC PHLEBOTOMIST [facility] None  04/07/2024  2:30 PM Marieli Rudy, Chinita, MD [facility] None  11/11/2024  8:30 AM Early, Camie BRAVO, NP [facility] [address]  09/18/2025  8:15 AM Cleotilde Ronal RAMAN, MD [facility]OBGYN [phone] [place]    This document was completed utilizing speech  recognition software. Grammatical errors, random word insertions, pronoun errors, and incomplete sentences are an occasional consequence of this system due to software limitations, ambient noise, and hardware issues. Any formal questions or concerns about the content, text or information contained within the body of this dictation should be directly addressed to the provider for clarification.

## 2023-12-20 ENCOUNTER — Ambulatory Visit (INDEPENDENT_AMBULATORY_CARE_PROVIDER_SITE_OTHER)

## 2023-12-20 DIAGNOSIS — Z0000 Encounter for general adult medical examination without abnormal findings: Secondary | ICD-10-CM | POA: Diagnosis not present

## 2023-12-20 NOTE — Patient Instructions (Signed)
 Tara Hendrix,  Thank you for taking the time for your Medicare Wellness Visit. I appreciate your continued commitment to your health goals. Please review the care plan we discussed, and feel free to reach out if I can assist you further.  Medicare recommends these wellness visits once per year to help you and your care team stay ahead of potential health issues. These visits are designed to focus on prevention, allowing your provider to concentrate on managing your acute and chronic conditions during your regular appointments.  Please note that Annual Wellness Visits do not include a physical exam. Some assessments may be limited, especially if the visit was conducted virtually. If needed, we may recommend a separate in-person follow-up with your provider.  Ongoing Care Seeing your primary care provider every 3 to 6 months helps us  monitor your health and provide consistent, personalized care.   Referrals If a referral was made during today's visit and you haven't received any updates within two weeks, please contact the referred provider directly to check on the status.  Recommended Screenings:  Health Maintenance  Topic Date Due   Pneumococcal Vaccine for age over 71 (2 of 2 - PCV) 10/18/2022   COVID-19 Vaccine (6 - 2025-26 season) 10/29/2023   Flu Shot  05/27/2024*   Breast Cancer Screening  05/20/2024   Medicare Annual Wellness Visit  12/19/2024   Colon Cancer Screening  04/01/2026   DTaP/Tdap/Td vaccine (3 - Td or Tdap) 08/10/2030   DEXA scan (bone density measurement)  Completed   Hepatitis C Screening  Completed   Zoster (Shingles) Vaccine  Completed   Meningitis B Vaccine  Aged Out   Screening for Lung Cancer  Discontinued  *Topic was postponed. The date shown is not the original due date.       12/20/2023    2:02 PM  Advanced Directives  Does Patient Have a Medical Advance Directive? Yes  Type of Estate agent of [place];Living will  Copy of  Healthcare Power of Attorney in Chart? No - copy requested   Advance Care Planning is important because it: Ensures you receive medical care that aligns with your values, goals, and preferences. Provides guidance to your family and loved ones, reducing the emotional burden of decision-making during critical moments.  Vision: Annual vision screenings are recommended for early detection of glaucoma, cataracts, and diabetic retinopathy. These exams can also reveal signs of chronic conditions such as diabetes and high blood pressure.  Dental: Annual dental screenings help detect early signs of oral cancer, gum disease, and other conditions linked to overall health, including heart disease and diabetes.  Please see the attached documents for additional preventive care recommendations.

## 2023-12-20 NOTE — Progress Notes (Signed)
 Subjective:   Tara Hendrix is a 68 y.o. who presents for a Medicare Wellness preventive visit.  As a reminder, Annual Wellness Visits don't include a physical exam, and some assessments may be limited, especially if this visit is performed virtually. We may recommend an in-person follow-up visit with your provider if needed.  Visit Complete: Virtual I connected with  Sharlet Macario Raspberry on 12/20/23 by a video and audio enabled telemedicine application and verified that I am speaking with the correct person using two identifiers.  Patient Location: Home  Provider Location: Home Office  I discussed the limitations of evaluation and management by telemedicine. The patient expressed understanding and agreed to proceed.  Vital Signs: Because this visit was a virtual/telehealth visit, some criteria may be missing or patient reported. Any vitals not documented were not able to be obtained and vitals that have been documented are patient reported.    Persons Participating in Visit: Patient.  AWV Questionnaire: Yes: Patient Medicare AWV questionnaire was completed by the patient on 12/18/2023; I have confirmed that all information answered by patient is correct and no changes since this date.  Cardiac Risk Factors include: advanced age (>8men, >32 women)     Objective:    Today's Vitals   There is no height or weight on file to calculate BMI.     12/20/2023    2:02 PM 12/04/2023    2:08 PM 11/28/2022    2:00 PM 11/04/2021    4:11 PM 12/27/2018   10:45 AM  Advanced Directives  Does Patient Have a Medical Advance Directive? Yes No Yes No No  Type of Estate agent of [place];Living will  Healthcare Power of [place];Living will    Copy of Healthcare Power of Attorney in Chart? No - copy requested  No - copy requested    Would patient like information on creating a medical advance directive?  No - Patient declined   No - Patient declined    Current  Medications (verified) Outpatient Encounter Medications as of 12/20/2023  Medication Sig   ALPRAZolam  (XANAX ) 0.25 MG tablet Take 1 tablet (0.25 mg total) by mouth 2 (two) times daily as needed for anxiety.   atorvastatin  (LIPITOR) 10 MG tablet Take 1 tablet (10 mg total) by mouth 3 (three) times a week.   Cholecalciferol (VITAMIN D3) 50 MCG (2000 UT) TABS Take 1 tablet by mouth daily.   Misc Natural Products (TURMERIC, CURCUMIN, PO) Take by mouth.   pantoprazole  (PROTONIX ) 40 MG tablet TAKE 1 TABLET BY MOUTH TWICE  DAILY   Probiotic Product (PROBIOTIC PO) Take 1 tablet by mouth daily.   thiamine (VITAMIN B-1) 100 MG tablet Take 100 mg by mouth daily.   UNABLE TO FIND Take 1 Scoop by mouth daily. Med Name: Total Beets (Balance of Nature)   vitamin B-12 (CYANOCOBALAMIN) 100 MCG tablet Take 100 mcg by mouth daily.   zolpidem  (AMBIEN ) 5 MG tablet TAKE ONE TABLET BY MOUTH AT BEDTIME AS NEEDED FOR SLEEP   No facility-administered encounter medications on file as of 12/20/2023.    Allergies (verified) Patient has no known allergies.   History: Past Medical History:  Diagnosis Date   Allergy    Anemia, iron deficiency    Constipation    Gastric polyps 03/2019   hyperplastic   GERD (gastroesophageal reflux disease)    Hiatal hernia    History of chickenpox    Hx of adenomatous colonic polyps 04/07/2019   Insomnia    Internal hemorrhoids  Kidney stone    Seasonal allergies    Tubular adenoma of colon 09/13/2011   removed from the decending colon   Past Surgical History:  Procedure Laterality Date   BREAST BIOPSY Left 2020   BUNIONECTOMY Right 12/15/2011   COLONOSCOPY     COLONOSCOPY W/ BIOPSIES  09/13/2011   ESOPHAGOGASTRODUODENOSCOPY  09/13/2011   TONSILLECTOMY  1964   UPPER GASTROINTESTINAL ENDOSCOPY     Family History  Problem Relation Age of Onset   Heart attack Mother    Hypertension Mother    Lung cancer Mother    Hearing loss Father    Drug abuse Brother     Cancer Paternal Grandmother        unsure type   Stroke Paternal Grandfather    Intellectual disability Paternal Aunt    Breast cancer Neg Hx    Colon cancer Neg Hx    Esophageal cancer Neg Hx    Rectal cancer Neg Hx    Stomach cancer Neg Hx    Social History   Socioeconomic History   Marital status: Married    Spouse name: Not on file   Number of children: 0   Years of education: Not on file   Highest education level: 12th grade  Occupational History   Occupation: Airline pilot  Tobacco Use   Smoking status: Former    Current packs/day: 0.00    Average packs/day: 0.5 packs/day for 36.5 years (18.2 ttl pk-yrs)    Types: Cigarettes    Start date: 02/27/1982    Quit date: 08/28/2018    Years since quitting: 5.3   Smokeless tobacco: Never  Vaping Use   Vaping status: Former  Substance and Sexual Activity   Alcohol use: Yes    Comment: 4 per day   Drug use: No   Sexual activity: Yes    Partners: Male    Birth control/protection: Surgical, Post-menopausal    Comment: vasectomy  Other Topics Concern   Not on file  Social History Narrative   She is married without children   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   3 alcoholic beverages a day   3 caffeinated beverages a day   No drug or tobacco use former smoker   Social Drivers of Corporate investment banker Strain: Low Risk  (12/18/2023)   Overall Financial Resource Strain (CARDIA)    Difficulty of Paying Living Expenses: Not hard at all  Food Insecurity: No Food Insecurity (12/18/2023)   Hunger Vital Sign    Worried About Running Out of Food in the Last Year: Never true    Ran Out of Food in the Last Year: Never true  Transportation Needs: No Transportation Needs (12/18/2023)   PRAPARE - Administrator, Civil Service (Medical): No    Lack of Transportation (Non-Medical): No  Physical Activity: Sufficiently Active (12/18/2023)   Exercise Vital Sign     Days of Exercise per Week: 7 days    Minutes of Exercise per Session: 50 min  Stress: Stress Concern Present (12/18/2023)   [employer] of Occupational Health - Occupational Stress Questionnaire    Feeling of Stress: To some extent  Social Connections: Moderately Integrated (12/18/2023)   Social Connection and Isolation Panel    Frequency of Communication with Friends and Family: More than three times a week    Frequency of Social Gatherings with Friends and Family: Twice a week    Attends Religious  Services: Never    Active Member of Clubs or Organizations: Yes    Attends Engineer, structural: More than 4 times per year    Marital Status: Married    Tobacco Counseling Counseling given: Not Answered    Clinical Intake:  Pre-visit preparation completed: Yes  Pain : No/denies pain     Nutritional Risks: None Diabetes: No  Lab Results  Component Value Date   HGBA1C 5.6 10/23/2023   HGBA1C 5.6 10/17/2021   HGBA1C 5.7 (H) 08/09/2020     How often do you need to have someone help you when you read instructions, pamphlets, or other written materials from your doctor or pharmacy?: 1 - Never  Interpreter Needed?: No  Information entered by :: NAllen LPN   Activities of Daily Living     12/18/2023    5:42 AM  In your present state of health, do you have any difficulty performing the following activities:  Hearing? 0  Vision? 0  Difficulty concentrating or making decisions? 0  Walking or climbing stairs? 0  Dressing or bathing? 0  Doing errands, shopping? 0  Preparing Food and eating ? N  Using the Toilet? N  In the past six months, have you accidently leaked urine? N  Do you have problems with loss of bowel control? N  Managing your Medications? N  Managing your Finances? N  Housekeeping or managing your Housekeeping? N    Patient Care Team: Early, Camie BRAVO, NP as PCP - General (Nurse Practitioner) Cleotilde Ronal RAMAN, MD as Consulting Physician  (Gynecology) Livingston Rigg, MD as Consulting Physician (Dermatology)  I have updated your Care Teams any recent Medical Services you may have received from other providers in the past year.     Assessment:   This is a routine wellness examination for Sweta.  Hearing/Vision screen Hearing Screening - Comments:: Denies hearing issues Vision Screening - Comments:: Regular eye exams, [facility]   Goals Addressed             This Visit's Progress    Patient Stated       12/20/2023, keep anxiety down       Depression Screen     12/20/2023    2:03 PM 12/04/2023    2:10 PM 10/23/2023    9:42 AM 11/28/2022    2:01 PM 10/19/2022    8:28 AM 10/17/2021    8:20 AM 09/12/2021    1:45 PM  PHQ 2/9 Scores  PHQ - 2 Score 0 0 0 0 0 0 0  PHQ- 9 Score    2  4   Exception Documentation      Medical reason     Fall Risk     12/18/2023    5:42 AM 10/23/2023    9:42 AM 11/27/2022    2:36 PM 10/19/2022    8:28 AM 09/26/2022   11:31 AM  Fall Risk   Falls in the past year? 0 0 1 1 0  Comment   fell down the steps    Number falls in past yr: 0 0 0 0 0  Injury with Fall? 0 0 0 0 0  Risk for fall due to : Medication side effect No Fall Risks Medication side effect No Fall Risks No Fall Risks  Follow up Falls evaluation completed;Falls prevention discussed Falls evaluation completed Falls prevention discussed;Falls evaluation completed Falls evaluation completed Falls evaluation completed    MEDICARE RISK AT HOME:  Medicare Risk at Home Any stairs in or  around the home?: (Patient-Rptd) Yes If so, are there any without handrails?: (Patient-Rptd) No Home free of loose throw rugs in walkways, pet beds, electrical cords, etc?: (Patient-Rptd) No Adequate lighting in your home to reduce risk of falls?: (Patient-Rptd) Yes Life alert?: (Patient-Rptd) No Use of a cane, walker or w/c?: (Patient-Rptd) No Grab bars in the bathroom?: (Patient-Rptd) No Shower chair or bench in shower?: (Patient-Rptd)  Yes Elevated toilet seat or a handicapped toilet?: (Patient-Rptd) No  TIMED UP AND GO:  Was the test performed?  No  Cognitive Function: 6CIT completed        12/20/2023    2:03 PM 11/28/2022    2:02 PM  6CIT Screen  What Year? 0 points 0 points  What month? 0 points 0 points  What time? 0 points 0 points  Count back from 20 0 points 0 points  Months in reverse 0 points 0 points  Repeat phrase 2 points 0 points  Total Score 2 points 0 points    Immunizations Immunization History  Administered Date(s) Administered   Fluad Trivalent(High Dose 65+) 12/04/2022   Influenza, Quadrivalent, Recombinant, Inj, Pf 11/27/2021   Influenza,inj,Quad PF,6+ Mos 11/21/2018   PFIZER(Purple Top)SARS-COV-2 Vaccination 05/19/2019, 06/16/2019, 12/23/2019, 09/22/2020   Pfizer(Comirnaty)Fall Seasonal Vaccine 12 years and older 12/04/2022   Pneumococcal Polysaccharide-23 02/01/2016, 10/17/2021   Tdap 10/23/2010, 08/09/2020   Zoster Recombinant(Shingrix ) 10/19/2022, 01/08/2023   Zoster, Live 02/28/2011    Screening Tests Health Maintenance  Topic Date Due   Pneumococcal Vaccine: 50+ Years (2 of 2 - PCV) 10/18/2022   COVID-19 Vaccine (6 - 2025-26 season) 10/29/2023   Influenza Vaccine  05/27/2024 (Originally 09/28/2023)   Mammogram  05/20/2024   Medicare Annual Wellness (AWV)  12/19/2024   Colonoscopy  04/01/2026   DTaP/Tdap/Td (3 - Td or Tdap) 08/10/2030   DEXA SCAN  Completed   Hepatitis C Screening  Completed   Zoster Vaccines- Shingrix   Completed   Meningococcal B Vaccine  Aged Out   Lung Cancer Screening  Discontinued    Health Maintenance Items Addressed: Vaccines Due: pneumonia, flu covid  Additional Screening:  Vision Screening: Recommended annual ophthalmology exams for early detection of glaucoma and other disorders of the eye. Is the patient up to date with their annual eye exam?  Yes  Who is the provider or what is the name of the office in which the patient attends annual  eye exams? [facility]  Dental Screening: Recommended annual dental exams for proper oral hygiene  Community Resource Referral / Chronic Care Management: CRR required this visit?  No   CCM required this visit?  No   Plan:    I have personally reviewed and noted the following in the patient's chart:   Medical and social history Use of alcohol, tobacco or illicit drugs  Current medications and supplements including opioid prescriptions. Patient is not currently taking opioid prescriptions. Functional ability and status Nutritional status Physical activity Advanced directives List of other physicians Hospitalizations, surgeries, and ER visits in previous 12 months Vitals Screenings to include cognitive, depression, and falls Referrals and appointments  In addition, I have reviewed and discussed with patient certain preventive protocols, quality metrics, and best practice recommendations. A written personalized care plan for preventive services as well as general preventive health recommendations were provided to patient.   Ardella FORBES Dawn, LPN   [id number]   After Visit Summary: (MyChart) Due to this being a telephonic visit, the after visit summary with patients personalized plan was offered to patient via  MyChart   Notes: Nothing significant to report at this time.

## 2023-12-25 ENCOUNTER — Encounter: Payer: Self-pay | Admitting: Oncology

## 2023-12-25 ENCOUNTER — Inpatient Hospital Stay: Admitting: Oncology

## 2023-12-25 DIAGNOSIS — R790 Abnormal level of blood mineral: Secondary | ICD-10-CM

## 2023-12-25 NOTE — Assessment & Plan Note (Signed)
 Labs at her PCPs office on 10/23/2023 showed increased iron of 170 mcg/dL.  Iron saturation was borderline increased at 50%, ferritin 128.  Previously labs in August 2024 also showed increased iron of 188 mcg/dL, ferritin at 181 ng/mL and iron saturation was increased at 52%.  Given these findings, referral was sent to us  for further evaluation.  Ferritin levels are normal this year, with a slight elevation last year. Liver and thyroid  functions are normal, indicating no organ damage.    On her consultation with us  on 12/04/2023, CBCD was entirely unremarkable.  CMP was also unremarkable with normal LFTs.  Ferritin was normal at 113.  Total iron was also normal at 124 mcg/dL iron saturation was normal at 30%.  Sed rate was normal at 4 mm/h.  CRP normal at 0.6 mg/dL.   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  There was no evidence of C282Y or S65C mutations.  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  This does not cause headaches or hemochromatosis.  Patient was provided reassurance.   Re-evaluate iron levels in 4 months.  If stable labs, she can be discharged from our office after next visit.

## 2023-12-25 NOTE — Progress Notes (Signed)
 [facility]  HEMATOLOGY-ONCOLOGY ELECTRONIC VISIT PROGRESS NOTE  PATIENT NAME: Tara Hendrix   MR#: [id number] DOB: [birth year]  DATE OF SERVICE: 12/25/2023  Patient Care Team: Early, Camie BRAVO, NP as PCP - General (Nurse Practitioner) Cleotilde Ronal RAMAN, MD as Consulting Physician (Gynecology) Livingston Rigg, MD as Consulting Physician (Dermatology)  I connected with the patient via telephone conference and verified that I am speaking with the correct person using two identifiers. The patient's location is at home and I am providing care from the [facility].  I discussed the limitations, risks, security and privacy concerns of performing an evaluation and management service by e-visits and the availability of in person appointments. I also discussed with the patient that there may be a patient responsible charge related to this service. The patient expressed understanding and agreed to proceed.   ASSESSMENT & PLAN:   Tara Hendrix is a 68 y.o. lady with a past medical history of hypertension, GERD, renal calculi, colon polyps was referred to our service in October 2025 for evaluation of increased serum iron.    Raised serum iron Labs at her PCPs office on 10/23/2023 showed increased iron of 170 mcg/dL.  Iron saturation was borderline increased at 50%, ferritin 128.  Previously labs in August 2024 also showed increased iron of 188 mcg/dL, ferritin at 181 ng/mL and iron saturation was increased at 52%.  Given these findings, referral was sent to us  for further evaluation.  Ferritin levels are normal this year, with a slight elevation last year. Liver and thyroid  functions are normal, indicating no organ damage.    On her consultation with us  on 12/04/2023, CBCD was entirely unremarkable.  CMP was also unremarkable with normal LFTs.  Ferritin was normal at 113.  Total iron was also normal at 124 mcg/dL iron saturation was normal at 30%.  Sed rate was normal at 4 mm/h.   CRP normal at 0.6 mg/dL.   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  There was no evidence of C282Y or S65C mutations.  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  This does not cause headaches or hemochromatosis.  Patient was provided reassurance.   Re-evaluate iron levels in 4 months.  If stable labs, she can be discharged from our office after next visit.    I discussed the assessment and treatment plan with the patient. The patient was provided an opportunity to ask questions and all were answered. The patient agreed with the plan and demonstrated an understanding of the instructions. The patient was advised to call back or seek an in-person evaluation if the symptoms worsen or if the condition fails to improve as anticipated.    I spent 12 minutes over the phone with the patient reviewing test results, discuss management and coordination/planning of care.  Tara Patten, MD 12/25/2023 4:31 PM [facility] - A DEPT OF [facility] [address] [place] [address] Dept: [phone] Dept Fax: [phone]   INTERVAL HISTORY:  Please see above for problem oriented charting.  The purpose of today's discussion is to explain recent lab results and to formulate plan of care.  Discussed the use of AI scribe software for clinical note transcription with the patient, who gave verbal consent to proceed.  History of Present Illness Tara Hendrix is a 68 year old female who presents for follow-up of previously elevated iron levels.  She initially presented with  elevated iron levels, which prompted further investigation. Upon re-evaluation, her iron level was found to be normal at 124 mcg/dL, with normal being up to 170 mcg/dL. Iron saturation was normal at 30%, and ferritin was also normal at 113 ng/mL. Blood counts, liver function tests, and kidney function tests  were all within normal limits.  Inflammatory markers, including ESR and CRP, were checked and found to be normal. Genetic testing was conducted to assess for hemochromatosis. The main mutation, C282Y, was negative. However, one of the two genes for the H63D mutation was abnormal; this mutation alone does not typically cause hemochromatosis.     SUMMARY OF HEMATOLOGY HISTORY:  She was referred by her primary doctor for evaluation of high iron levels.   Labs at her PCPs office on 10/23/2023 showed increased iron of 170 mcg/dL.  Iron saturation was borderline increased at 50%, ferritin 128.  Previously labs in August 2024 also showed increased iron of 188 mcg/dL, ferritin at 181 ng/mL and iron saturation was increased at 52%.  Given these findings, referral was sent to us  for further evaluation.   She does not take iron supplements or consume a diet high in iron, such as red meat or spinach. Her iron saturation is at the upper limit of normal at 50%, with a cutoff of 55%. Ferritin levels were slightly elevated last year but are normal this year. She attributes some of her stress to her husband's ongoing hospice care for cancer since 2021.   She has a history of fluctuating blood pressure readings, ranging from normal to elevated levels, such as 124/80 to 150/85. She has never been on antihypertensive medication. Recent readings have been as high as 186/90, which she describes as 'stroke material', but they often decrease upon retesting. She occasionally checks her blood pressure at home, noting systolic readings around 145 to 150.   She recently began taking Lipitor three times a week (Monday, Wednesday, Friday) due to concerns about her cholesterol levels, despite having high HDL and suboptimal LDL levels. She has a history of being borderline anemic and took high levels of iron supplements a few times in her life, well before menopause.  Ferritin levels are normal this year, with a slight elevation  last year. Liver and thyroid  functions are normal, indicating no organ damage.  On her consultation with us  on 12/04/2023, CBCD was entirely unremarkable.  CMP was also unremarkable with normal LFTs.  Ferritin was normal at 113.  Total iron was also normal at 124 mcg/dL iron saturation was normal at 30%.  Sed rate was normal at 4 mm/h.  CRP normal at 0.6 mg/dL.   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  There was no evidence of C282Y or S65C mutations.  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  This does not cause headaches or hemochromatosis.  Patient was provided reassurance.   Re-evaluate iron levels in 4 months.  If stable labs, she can be discharged from our office after next visit.  REVIEW OF SYSTEMS:    Review of Systems - Oncology  All other pertinent systems were reviewed with the patient and are negative.  I have reviewed the past medical history, past surgical history, social history and family history with the patient and they are unchanged from previous note.  ALLERGIES:  She has no known allergies.  MEDICATIONS:  Current Outpatient Medications  Medication Sig Dispense Refill   ALPRAZolam  (XANAX ) 0.25 MG tablet Take 1 tablet (0.25 mg total) by mouth 2 (  two) times daily as needed for anxiety. 20 tablet 0   atorvastatin  (LIPITOR) 10 MG tablet Take 1 tablet (10 mg total) by mouth 3 (three) times a week. 36 tablet 1   Cholecalciferol (VITAMIN D3) 50 MCG (2000 UT) TABS Take 1 tablet by mouth daily.     Misc Natural Products (TURMERIC, CURCUMIN, PO) Take by mouth.     pantoprazole  (PROTONIX ) 40 MG tablet TAKE 1 TABLET BY MOUTH TWICE  DAILY 200 tablet 2   Probiotic Product (PROBIOTIC PO) Take 1 tablet by mouth daily.     thiamine (VITAMIN B-1) 100 MG tablet Take 100 mg by mouth daily.     UNABLE TO FIND Take 1 Scoop by mouth daily. Med Name: Total Beets (Balance of Nature)     vitamin B-12 (CYANOCOBALAMIN) 100 MCG tablet Take 100 mcg  by mouth daily.     zolpidem  (AMBIEN ) 5 MG tablet TAKE ONE TABLET BY MOUTH AT BEDTIME AS NEEDED FOR SLEEP 30 tablet 3   No current facility-administered medications for this visit.    PHYSICAL EXAMINATION:  Not performed today as it was a phone only visit  LABORATORY DATA:   I have reviewed the data as listed.  Recent Results (from the past 2160 hours)  CBC with Differential/Platelet     Status: Abnormal   Collection Time: 10/23/23 11:50 AM  Result Value Ref Range   WBC 6.2 3.4 - 10.8 x10E3/uL   RBC 4.33 3.77 - 5.28 x10E6/uL   Hemoglobin 14.5 11.1 - 15.9 g/dL   Hematocrit 56.5 65.9 - 46.6 %   MCV 100 (H) 79 - 97 fL   MCH 33.5 (H) 26.6 - 33.0 pg   MCHC 33.4 31.5 - 35.7 g/dL   RDW 88.0 88.2 - 84.5 %   Platelets 199 150 - 450 x10E3/uL   Neutrophils 66 Not Estab. %   Lymphs 23 Not Estab. %   Monocytes 8 Not Estab. %   Eos 2 Not Estab. %   Basos 1 Not Estab. %   Neutrophils Absolute 4.2 1.4 - 7.0 x10E3/uL   Lymphocytes Absolute 1.4 0.7 - 3.1 x10E3/uL   Monocytes Absolute 0.5 0.1 - 0.9 x10E3/uL   EOS (ABSOLUTE) 0.1 0.0 - 0.4 x10E3/uL   Basophils Absolute 0.0 0.0 - 0.2 x10E3/uL   Immature Granulocytes 0 Not Estab. %   Immature Grans (Abs) 0.0 0.0 - 0.1 x10E3/uL  CMP14+EGFR     Status: Abnormal   Collection Time: 10/23/23 11:50 AM  Result Value Ref Range   Glucose 115 (H) 70 - 99 mg/dL   BUN 12 8 - 27 mg/dL   Creatinine, Ser 9.26 0.57 - 1.00 mg/dL   eGFR 90 >40 fO/fpw/8.26   BUN/Creatinine Ratio 16 12 - 28   Sodium 141 134 - 144 mmol/L   Potassium 4.1 3.5 - 5.2 mmol/L   Chloride 102 96 - 106 mmol/L   CO2 21 20 - 29 mmol/L   Calcium  9.7 8.7 - 10.3 mg/dL   Total Protein 6.8 6.0 - 8.5 g/dL   Albumin 4.7 3.9 - 4.9 g/dL   Globulin, Total 2.1 1.5 - 4.5 g/dL   Bilirubin Total 0.6 0.0 - 1.2 mg/dL   Alkaline Phosphatase 74 44 - 121 IU/L   AST 21 0 - 40 IU/L   ALT 18 0 - 32 IU/L  Hemoglobin A1c     Status: None   Collection Time: 10/23/23 11:50 AM  Result Value Ref Range    Hgb A1c MFr Bld 5.6 4.8 -  5.6 %    Comment:          Prediabetes: 5.7 - 6.4          Diabetes: >6.4          Glycemic control for adults with diabetes: <7.0    Est. average glucose Bld gHb Est-mCnc 114 mg/dL  Lipid panel     Status: Abnormal   Collection Time: 10/23/23 11:50 AM  Result Value Ref Range   Cholesterol, Total 297 (H) 100 - 199 mg/dL   Triglycerides 88 0 - 149 mg/dL   HDL 896 >60 mg/dL   VLDL Cholesterol Cal 14 5 - 40 mg/dL   LDL Chol Calc (NIH) 819 (H) 0 - 99 mg/dL   LDL CALC COMMENT: Comment     Comment: Consider evaluating for Familial Hypercholesterolemia(FH), if clinically indicated.    Chol/HDL Ratio 2.9 0.0 - 4.4 ratio    Comment:                                   T. Chol/HDL Ratio                                             Men  Women                               1/2 Avg.Risk  3.4    3.3                                   Avg.Risk  5.0    4.4                                2X Avg.Risk  9.6    7.1                                3X Avg.Risk 23.4   11.0   Iron, TIBC and Ferritin Panel     Status: Abnormal   Collection Time: 10/23/23 11:50 AM  Result Value Ref Range   Total Iron Binding Capacity 341 250 - 450 ug/dL   UIBC 828 881 - 630 ug/dL   Iron 829 (H) 27 - 860 ug/dL   Iron Saturation 50 15 - 55 %   Ferritin 128 15 - 150 ng/mL  Vitamin B12     Status: None   Collection Time: 10/23/23 11:50 AM  Result Value Ref Range   Vitamin B-12 1,021 232 - 1,245 pg/mL  VITAMIN D  25 Hydroxy (Vit-D Deficiency, Fractures)     Status: None   Collection Time: 10/23/23 11:50 AM  Result Value Ref Range   Vit D, 25-Hydroxy 38.0 30.0 - 100.0 ng/mL    Comment: Vitamin D  deficiency has been defined by the Institute of Medicine and an Endocrine Society practice guideline as a level of serum 25-OH vitamin D  less than 20 ng/mL (1,2). The Endocrine Society went on to further define vitamin D  insufficiency as a level between 21 and 29 ng/mL (2). 1. IOM (Institute of Medicine).  2010. Dietary reference    intakes for  calcium  and D. [place]: The    [employer]. 2. Holick MF, Binkley [place], Bischoff-Ferrari HA, et al.    Evaluation, treatment, and prevention of vitamin D     deficiency: an Endocrine Society clinical practice    guideline. JCEM. 2011 Jul; 96(7):1911-30.   Magnesium     Status: None   Collection Time: 10/23/23 11:50 AM  Result Value Ref Range   Magnesium 1.7 1.6 - 2.3 mg/dL  TSH + free T4     Status: None   Collection Time: 10/23/23 11:50 AM  Result Value Ref Range   TSH 0.952 0.450 - 4.500 uIU/mL   Free T4 1.13 0.82 - 1.77 ng/dL  C-reactive protein     Status: None   Collection Time: 12/04/23  2:37 PM  Result Value Ref Range   CRP 0.6 <1.0 mg/dL    Comment: Performed at [facility], [address] [place], [place] [address]  Sedimentation rate     Status: None   Collection Time: 12/04/23  2:37 PM  Result Value Ref Range   Sed Rate 4 0 - 22 mm/hr    Comment: Performed at [facility], [address], [place], [place] [address]  Iron and TIBC     Status: None   Collection Time: 12/04/23  2:37 PM  Result Value Ref Range   Iron 124 28 - 170 ug/dL   TIBC 585 749 - 549 ug/dL   Saturation Ratios 30 10.4 - 31.8 %   UIBC 290 ug/dL    Comment: Performed at [facility], [address], [place], [place] [address]  CMP (Cancer Center only)     Status: Abnormal   Collection Time: 12/04/23  2:37 PM  Result Value Ref Range   Sodium 140 135 - 145 mmol/L   Potassium 4.4 3.5 - 5.1 mmol/L   Chloride 104 98 - 111 mmol/L   CO2 25 22 - 32 mmol/L   Glucose, Bld 109 (H) 70 - 99 mg/dL    Comment: Glucose reference range applies only to samples taken after fasting for at least 8 hours.   BUN 13 8 - 23 mg/dL   Creatinine 9.16 9.55 - 1.00 mg/dL   Calcium  10.2 8.9 - 10.3 mg/dL   Total Protein 7.1 6.5 - 8.1 g/dL   Albumin 4.6 3.5 - 5.0 g/dL   AST 20 15 - 41 U/L   ALT 17 0 - 44 U/L   Alkaline Phosphatase 70  38 - 126 U/L   Total Bilirubin 0.4 0.0 - 1.2 mg/dL   GFR, Estimated >39 >39 mL/min    Comment: (NOTE) Calculated using the CKD-EPI Creatinine Equation (2021)    Anion gap 11 5 - 15    Comment: Performed at [facility], [address], [place], [place] [address]  CBC with Differential (Cancer Center Only)     Status: None   Collection Time: 12/04/23  2:37 PM  Result Value Ref Range   WBC Count 7.1 4.0 - 10.5 K/uL   RBC 3.96 3.87 - 5.11 MIL/uL   Hemoglobin 13.2 12.0 - 15.0 g/dL   HCT 61.2 63.9 - 53.9 %   MCV 97.7 80.0 - 100.0 fL   MCH 33.3 26.0 - 34.0 pg   MCHC 34.1 30.0 - 36.0 g/dL   RDW 87.5 88.4 - 84.4 %   Platelet Count 182 150 - 400 K/uL   nRBC 0.0 0.0 - 0.2 %   Neutrophils Relative % 67 %  Neutro Abs 4.7 1.7 - 7.7 K/uL   Lymphocytes Relative 24 %   Lymphs Abs 1.7 0.7 - 4.0 K/uL   Monocytes Relative 7 %   Monocytes Absolute 0.5 0.1 - 1.0 K/uL   Eosinophils Relative 1 %   Eosinophils Absolute 0.1 0.0 - 0.5 K/uL   Basophils Relative 1 %   Basophils Absolute 0.0 0.0 - 0.1 K/uL   Immature Granulocytes 0 %   Abs Immature Granulocytes 0.02 0.00 - 0.07 K/uL    Comment: Performed at [facility], [address] [place], [place] [address]  Hemochromatosis DNA, PCR     Status: None   Collection Time: 12/04/23  2:37 PM  Result Value Ref Range   DNA Mutation Analysis Comment     Comment: (NOTE) Results: c.845G>A (p.Cys282Tyr) - Not Detected c.187C>G (p.His63Asp) - Detected, heterozygous c.193A>T (p.Ser65Cys) - Not Detected Not associated with increased risk to develop clinical symptoms of Hereditary Hemochromatosis. In symptomatic individuals, other causes of iron overload should be evaluated. See Additional Information and Comments. Additional Clinical Information: Hereditary hemochromatosis (HFE related) is an autosomal recessive iron storage disorder. Patients may have a genetic diagnosis of hereditary hemochromatosis and never  show clinical symptoms. Clinical symptoms typically appear between 40 to 60 years in males and after menopause in females. Signs and symptoms may include organ damage, primarily in the liver, risk for hepatocellular carcinoma, diabetes, and heart disease due to iron accumulation. Life expectancy may be decreased in individuals who develop cirrhosis. Treatment for clinically symptomatic individuals may include therapeutic ph lebotomy. Liver transplant may be used to treat end stage liver failure. For preventive care, monitoring for iron overload is recommended for patients who are homozygous for c.845G>A (p.Cys282Tyr) and have yet to experience clinical symptoms. Comments: The most common HFE variants associated with hereditary hemochromatosis are c.845G>A (p.Cys282Tyr), c.187C>G (p.His63Asp), c.193A>T (p.Ser65Cys). While patients homozygous for c.845G>A (p.Cys282Tyr) are the most likely to present clinical symptoms, less than 10% develop clinically significant iron overload with tissue and organ damage. Genetic counseling is recommended to discuss the potential clinical implications of positive results, as well as recommendations for testing family members. Genetic Coordinators are available for health care providers to discuss results at [phone]). Test Details: Three variants analyzed: c.845G>A (p.Cys282Tyr), commonly referred to as C282Y c.187C>G (p.His63Asp), commonly re ferred to as H63D c.193A>T (p.Ser65Cys), commonly referred to as S65C Methods/Limitations: DNA Analysis of the HFE gene (NM_[id number]) was performed by PCR amplification followed by restriction enzyme digestion analyses. Results must be combined with clinical information for the most accurate interpretation. Molecular-based testing is highly accurate, but as in any laboratory test, diagnostic errors may occur. False positive or false negative results may occur for reasons that include genetic  variants, blood transfusions, bone marrow transplantation, somatic or tissue-specific mosaicism, mislabeled samples, or erroneous representation of family relationships. This test was developed and its performance characteristics determined by [facility]. It has not been cleared or approved by the Food and Drug Administration. References: [place] CAVES, [address], [place] SONNA Monte LW, [place] AS; American Association for the Study of Liver Diseases. Diagnosis and management of hemochromat osis: 2011 practice guideline by the American Association for the Study of Liver Diseases. Hepatology. 2011 Jul;54(1):328-43. doi: 10.1002/hep.24330. PMID: 78547709; PMCID: EFR6850874. [address], Brissot P, Swinkels DW, Zoller H, Kamarainen O, Patton S, Alonso I, Morris M, Keeney S. EMQN best practice guidelines for the molecular genetic diagnosis of hereditary hemochromatosis [facility]). Eur J Hum Genet. 2016 Apr;24(4):479-95. doi: 10.1038/ejhg.2015.128. Epub 2015 Jul 8. PMID:  73846781; PMCID: EFR5070138.    Reviewed by: Comment     Comment: (NOTE) Technical Component performed at [employer] [facility] Professional Component performed by: W. Wanda Norse, PhD, [facility], [facility], [address] [place] [address] Performed At: [facility] [address] [place], [place] [id number] Loran Gales MDPhD Ey:[phone]   Ferritin     Status: None   Collection Time: 12/04/23  2:38 PM  Result Value Ref Range   Ferritin 113 11 - 307 ng/mL    Comment: Performed at [facility], [address], [place], [place] [address]     RADIOGRAPHIC STUDIES:  No recent pertinent imaging studies available to review.  Orders Placed This Encounter  Procedures   CBC with Differential (Cancer Center Only)    Standing Status:   Future    Expected Date:   04/07/2024    Expiration Date:   07/06/2024   CMP (Cancer Center only)    Standing Status:   Future    Expected Date:   04/07/2024    Expiration Date:    07/06/2024   Iron and TIBC    Standing Status:   Future    Expected Date:   04/07/2024    Expiration Date:   07/06/2024   Ferritin    Standing Status:   Future    Expected Date:   04/07/2024    Expiration Date:   07/06/2024     Future Appointments  Date Time Provider Department Center  12/31/2023  9:00 AM [facility]  01/17/2024  8:30 AM [facility] [address]  04/07/2024  2:15 PM [facility] PHLEBOTOMIST [facility] None  04/07/2024  2:30 PM Arnola Crittendon, Chinita, MD [facility] None  11/11/2024  8:30 AM Early, Camie BRAVO, NP [facility] [address]  09/18/2025  8:15 AM Cleotilde Ronal RAMAN, MD [facility]OBGYN [phone] [place]    This document was completed utilizing speech recognition software. Grammatical errors, random word insertions, pronoun errors, and incomplete sentences are an occasional consequence of this system due to software limitations, ambient noise, and hardware issues. Any formal questions or concerns about the content, text or information contained within the body of this dictation should be directly addressed to the provider for clarification.

## 2023-12-25 NOTE — Progress Notes (Signed)
 Phone visit was not completed today and rescheduled for next week.

## 2023-12-26 ENCOUNTER — Telehealth: Payer: Self-pay | Admitting: Internal Medicine

## 2023-12-26 NOTE — Telephone Encounter (Signed)
  Please advise if Pneumonia shot is needed and which one and then we can schedule?  Copied from CRM [id number]. Topic: Appointments - Scheduling Inquiry for Clinic >> Dec 26, 2023  1:11 PM Lauren C wrote: Reason for CRM: Pt wants to schedule an appointment to get flu, covid and pneumonia vaccine. Per KMS, CRM to clinic is needed for scheduling pneumovax. Please call patient for scheduling at [phone]. Requesting 11/20 around 8:30AM lab time.

## 2023-12-28 NOTE — Telephone Encounter (Signed)
 Please schedule Pna20 at her convenience.  OK to give COVID if she would like to have that, as well.

## 2023-12-31 ENCOUNTER — Ambulatory Visit
Admission: RE | Admit: 2023-12-31 | Discharge: 2023-12-31 | Disposition: A | Discharge status: Home / Self Care | Source: Ambulatory Visit | Attending: Nurse Practitioner | Admitting: Nurse Practitioner

## 2023-12-31 DIAGNOSIS — Z87891 Personal history of nicotine dependence: Secondary | ICD-10-CM

## 2023-12-31 DIAGNOSIS — Z122 Encounter for screening for malignant neoplasm of respiratory organs: Secondary | ICD-10-CM

## 2024-01-04 ENCOUNTER — Other Ambulatory Visit: Payer: Self-pay | Admitting: Acute Care

## 2024-01-04 DIAGNOSIS — Z87891 Personal history of nicotine dependence: Secondary | ICD-10-CM

## 2024-01-04 DIAGNOSIS — Z122 Encounter for screening for malignant neoplasm of respiratory organs: Secondary | ICD-10-CM

## 2024-01-17 ENCOUNTER — Other Ambulatory Visit (INDEPENDENT_AMBULATORY_CARE_PROVIDER_SITE_OTHER): Payer: Self-pay

## 2024-01-17 DIAGNOSIS — Z23 Encounter for immunization: Secondary | ICD-10-CM | POA: Diagnosis not present

## 2024-01-17 DIAGNOSIS — I700 Atherosclerosis of aorta: Secondary | ICD-10-CM

## 2024-01-17 DIAGNOSIS — E782 Mixed hyperlipidemia: Secondary | ICD-10-CM

## 2024-01-17 LAB — COMPREHENSIVE METABOLIC PANEL WITH GFR
ALT: 17 IU/L (ref 0–32)
AST: 19 IU/L (ref 0–40)
Albumin: 4.1 g/dL (ref 3.9–4.9)
Alkaline Phosphatase: 78 IU/L (ref 49–135)
BUN/Creatinine Ratio: 17 (ref 12–28)
BUN: 14 mg/dL (ref 8–27)
Bilirubin Total: 0.4 mg/dL (ref 0.0–1.2)
CO2: 22 mmol/L (ref 20–29)
Calcium: 9.2 mg/dL (ref 8.7–10.3)
Chloride: 106 mmol/L (ref 96–106)
Creatinine, Ser: 0.81 mg/dL (ref 0.57–1.00)
Globulin, Total: 1.9 g/dL (ref 1.5–4.5)
Glucose: 105 mg/dL — ABNORMAL HIGH (ref 70–99)
Potassium: 4.3 mmol/L (ref 3.5–5.2)
Sodium: 142 mmol/L (ref 134–144)
Total Protein: 6 g/dL (ref 6.0–8.5)
eGFR: 80 mL/min/1.73 (ref >59)

## 2024-01-17 LAB — LIPID PANEL
Chol/HDL Ratio: 2.4 ratio (ref 0.0–4.4)
Cholesterol, Total: 213 mg/dL — ABNORMAL HIGH (ref 100–199)
HDL: 90 mg/dL (ref >39)
LDL Chol Calc (NIH): 113 mg/dL — ABNORMAL HIGH (ref 0–99)
Triglycerides: 56 mg/dL (ref 0–149)
VLDL Cholesterol Cal: 10 mg/dL (ref 5–40)

## 2024-01-17 NOTE — Progress Notes (Unsigned)
 Patient is in office today for a nurse visit for Immunization. Patient Injection was given in the  Right deltoid. Patient tolerated injection well.

## 2024-01-18 ENCOUNTER — Ambulatory Visit: Payer: Self-pay | Admitting: Nurse Practitioner

## 2024-01-18 ENCOUNTER — Other Ambulatory Visit: Payer: Self-pay

## 2024-01-18 DIAGNOSIS — D509 Iron deficiency anemia, unspecified: Secondary | ICD-10-CM

## 2024-01-18 DIAGNOSIS — E782 Mixed hyperlipidemia: Secondary | ICD-10-CM

## 2024-01-18 MED ORDER — ATORVASTATIN CALCIUM 10 MG PO TABS
ORAL_TABLET | ORAL | 1 refills | Status: AC
Start: 2024-01-18 — End: ?

## 2024-01-18 NOTE — Progress Notes (Signed)
 Patient called back, went over labs and plan to change med to 5 days.Scheduled Patient to come in 3 months for recheck.

## 2024-02-13 ENCOUNTER — Other Ambulatory Visit: Payer: Self-pay | Admitting: Nurse Practitioner

## 2024-02-13 DIAGNOSIS — Z0000 Encounter for general adult medical examination without abnormal findings: Secondary | ICD-10-CM

## 2024-02-13 DIAGNOSIS — F5101 Primary insomnia: Secondary | ICD-10-CM

## 2024-02-13 NOTE — Telephone Encounter (Signed)
 Last appt. 10/23/23.

## 2024-03-22 ENCOUNTER — Other Ambulatory Visit: Payer: Self-pay | Admitting: Nurse Practitioner

## 2024-03-22 DIAGNOSIS — F5101 Primary insomnia: Secondary | ICD-10-CM

## 2024-03-22 DIAGNOSIS — Z0000 Encounter for general adult medical examination without abnormal findings: Secondary | ICD-10-CM

## 2024-04-07 ENCOUNTER — Inpatient Hospital Stay

## 2024-04-07 ENCOUNTER — Inpatient Hospital Stay: Admitting: Oncology

## 2024-04-21 ENCOUNTER — Other Ambulatory Visit

## 2024-11-11 ENCOUNTER — Encounter: Payer: Self-pay | Admitting: Nurse Practitioner

## 2025-09-18 ENCOUNTER — Ambulatory Visit (HOSPITAL_BASED_OUTPATIENT_CLINIC_OR_DEPARTMENT_OTHER): Admitting: Obstetrics & Gynecology
